# Patient Record
Sex: Male | Born: 1990 | Race: Black or African American | Hispanic: No | Marital: Single | Smoking: Current some day smoker
Health system: Southern US, Community
[De-identification: ages and names within clinical notes are randomized; demographics above are authoritative.]

## PROBLEM LIST (undated history)

## (undated) DIAGNOSIS — E663 Overweight: Secondary | ICD-10-CM

## (undated) DIAGNOSIS — S62339A Displaced fracture of neck of unspecified metacarpal bone, initial encounter for closed fracture: Secondary | ICD-10-CM

## (undated) DIAGNOSIS — Z973 Presence of spectacles and contact lenses: Secondary | ICD-10-CM

## (undated) DIAGNOSIS — Z68.41 Body mass index (BMI) pediatric, 85th percentile to less than 95th percentile for age: Secondary | ICD-10-CM

## (undated) DIAGNOSIS — F17200 Nicotine dependence, unspecified, uncomplicated: Secondary | ICD-10-CM

## (undated) HISTORY — DX: Overweight: Z68.53

## (undated) HISTORY — DX: Body mass index (BMI) pediatric, 85th percentile to less than 95th percentile for age: E66.3

## (undated) HISTORY — DX: Presence of spectacles and contact lenses: Z97.3

## (undated) HISTORY — PX: NO PAST SURGERIES: SHX2092

---
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
## 2011-02-08 ENCOUNTER — Encounter: Payer: Self-pay | Admitting: Internal Medicine

## 2011-02-09 ENCOUNTER — Encounter: Payer: Self-pay | Admitting: Internal Medicine

## 2011-02-09 ENCOUNTER — Ambulatory Visit (INDEPENDENT_AMBULATORY_CARE_PROVIDER_SITE_OTHER): Payer: 59 | Admitting: Internal Medicine

## 2011-02-09 VITALS — BP 100/70 | HR 60 | Temp 98.6°F | Wt 165.0 lb

## 2011-02-09 DIAGNOSIS — L309 Dermatitis, unspecified: Secondary | ICD-10-CM

## 2011-02-09 DIAGNOSIS — L299 Pruritus, unspecified: Secondary | ICD-10-CM

## 2011-02-09 DIAGNOSIS — L259 Unspecified contact dermatitis, unspecified cause: Secondary | ICD-10-CM

## 2011-02-09 MED ORDER — HYDROXYZINE HCL 25 MG PO TABS: 25.0000 mg | ORAL_TABLET | Freq: Three times a day (TID) | ORAL | Status: AC | PRN

## 2011-02-09 MED ORDER — TRIAMCINOLONE ACETONIDE 0.1 % EX OINT: TOPICAL_OINTMENT | Freq: Two times a day (BID) | CUTANEOUS | Status: AC

## 2011-02-09 NOTE — Patient Instructions (Signed)
Use topical steroid ointment. Can take hydroxyzine at night to help itching If not getting better in 7-14 days then  Would have you see dermatologist.

## 2011-02-09 NOTE — Progress Notes (Signed)
  Subjective:    Patient ID: Mitchell Barber, male    DOB: April 16, 1991, 20 y.o.   MRN: [id number]  HPI Patient comes in today for an acute visit. He states he's had itching mostly on his arms forearms for about 2-3 weeks. Initially it was his left thigh that has resolved now is on his forearms initially there was no rationale he has tiny bumps on his exposed areas there is one bump on his abdomen. No known exposures. He uses [employer] and uses cocoa butter lotion to keep his skin moist. The above itching seems to be worse at night.  Had a history of a neck rash about a year ago was treated with cortisone prednisone felt to be contact dermatitis and resolved.   Review of Systems Negative fever chest pain shortness of breath swollen glands other exposures. No GI GU symptoms  Past history family history social history reviewed in the electronic medical record.     Objective:   Physical Exam Well-developed well-nourished in no acute distress some mild to moderate acne on face a few pustules on his neck. Otherwise neck is clear Skin: Somewhat excoriated forearms a few eczematous bumps on the right antecubital fossa there are no burrows noted hands are clear waist is clear he has some chronic changes over her his bucklel meets the skin.       Assessment & Plan:  Itching of forearms.. mild dermatitis.  No obvious burrows and not typical distribution of mites. Discussed avoiding hot showers continue moisturizing use prescription cortisone ointment hydroxyzine at night. Underlying history of contact dermatitis eczema and what appears to be a nickel sensitivity.  Other ?s answered

## 2012-02-16 ENCOUNTER — Other Ambulatory Visit: Payer: Self-pay | Admitting: Internal Medicine

## 2012-03-08 ENCOUNTER — Ambulatory Visit (INDEPENDENT_AMBULATORY_CARE_PROVIDER_SITE_OTHER): Payer: 59

## 2012-03-08 DIAGNOSIS — Z0000 Encounter for general adult medical examination without abnormal findings: Secondary | ICD-10-CM

## 2012-03-08 DIAGNOSIS — Y992 Volunteer activity: Secondary | ICD-10-CM

## 2012-03-10 LAB — TB SKIN TEST: TB Skin Test: NEGATIVE

## 2012-04-21 ENCOUNTER — Ambulatory Visit (INDEPENDENT_AMBULATORY_CARE_PROVIDER_SITE_OTHER): Payer: 59

## 2012-04-21 DIAGNOSIS — Z23 Encounter for immunization: Secondary | ICD-10-CM

## 2013-05-08 ENCOUNTER — Ambulatory Visit (INDEPENDENT_AMBULATORY_CARE_PROVIDER_SITE_OTHER): Payer: 59 | Admitting: Family Medicine

## 2013-05-08 DIAGNOSIS — Z23 Encounter for immunization: Secondary | ICD-10-CM

## 2014-06-26 ENCOUNTER — Ambulatory Visit: Payer: 59

## 2014-07-25 ENCOUNTER — Encounter: Payer: Self-pay | Admitting: Internal Medicine

## 2014-07-25 ENCOUNTER — Ambulatory Visit (INDEPENDENT_AMBULATORY_CARE_PROVIDER_SITE_OTHER): Payer: 59 | Admitting: Internal Medicine

## 2014-07-25 VITALS — BP 122/84 | HR 60 | Temp 98.3°F | Wt 165.4 lb

## 2014-07-25 DIAGNOSIS — F4323 Adjustment disorder with mixed anxiety and depressed mood: Secondary | ICD-10-CM

## 2014-07-25 DIAGNOSIS — R413 Other amnesia: Secondary | ICD-10-CM

## 2014-07-25 MED ORDER — CITALOPRAM HYDROBROMIDE 20 MG PO TABS: 20.0000 mg | ORAL_TABLET | Freq: Every day | ORAL | Status: DC

## 2014-07-25 NOTE — Patient Instructions (Addendum)
Agree with stopping mind altering substances and tobacco  both for  Mental health reasons and  Physical health.  YOu may have some type of anxiety diagnosis and even post traumatic stress disorder as we discussed  That is causing the problem   Advise we get you to see a behavioral specialist .   Consideration of medication to help also .  Decrease memory and concentration  Can occur woith anxiety   THC alcohol lack of sleep and my things.      Posttraumatic Stress Disorder Posttraumatic stress disorder (PTSD) is a mental disorder. It occurs after a traumatic event in your life. The traumatic events that cause PTSD are outside the range of normal human experience. Examples of these events include war, automobile accidents, natural disasters, rape, domestic violence, and violent crimes. Most people who experience these types of events are able to heal on their own. Those who do not heal develop PTSD. PTSD can happen to anyone at any age. However, people with a history of childhood abuse are at increased risk for developing PTSD.  SYMPTOMS  The traumatic event that causes PTSD must be a threat to life, cause serious injury, or involve sexual violence. The traumatic event is usually experienced directly by the person who develops PTSD. Sometimes PTSD occurs in people who witness traumas that occur to others or who hear about a trauma that occurs to a close family member or friend. The following behaviors are characteristic of people with PTSD:  People with PTSD re-experience the traumatic event in one or more of the following ways (intrusion symptoms):  Recurrent, unwanted distressing memories while awake.  Recurrent distressing dreams.  Sensations similar to those felt when the event originally occurred (flashbacks).   Intense or prolonged emotional distress, triggered by reminders of the trauma. This may include fear, horror, intense sadness, or anger.  Marked physical reactions,  triggered by reminders of the trauma. This may include racing heart, shortness of breath, sweating, and shaking.  People with PTSD avoid thoughts, conversations, people, or activities that remind them of the traumatic event (avoidance symptoms).  People with PTSD have negative changes in their thinking and mood after the traumatic event. These changes include:  Inability to remember one or more significant aspects of the traumatic event (memory gaps).  Exaggerated negative perceptions about themselves or others, such as believing that they are bad people or that no one can be trusted.  Unrealistic assignment of blame to themselves or others for the traumatic event.  Persistent negative emotional state, such as fear, horror, anger, sadness, guilt, or shame.  Markedly decreased interest or participation in significant activities.  A loss of connection with other people.  Inability to experience positive emotions, such as happiness or love.  People with PTSD are more sensitive to their environment and react more easily than others (hyperarousal-overreactivity symptoms). These symptoms include:  Irritability, with angry outbursts toward other people or objects. The outbursts are easily triggered and may be verbal or physical.  Careless or self-destructive behavior. This may include reckless driving or drug use.  A feeling of being on edge, with increased alertness (hypervigilance).  Exaggerated reactions to stimuli, such as being easily startled.   Difficulty concentrating.  Difficulty sleeping. PTSD symptoms may start soon after a frightening event or months or years later. They last at least 1 month or longer and can affect one or more areas of functioning, such as social or occupational functioning.  DIAGNOSIS  PTSD is diagnosed through an assessment  by a mental health professional. Bonita QuinYou will be asked questions about the traumatic events in your life. You will also be asked about  how these events have changed your thoughts, mood, behavior, and ability to function on a daily basis. You may be asked about your use of alcohol or drugs, which can make PTSD symptoms worse. TREATMENT  Unlike many mental disorders, which require lifelong management, PTSD is a curable condition. The goal of PTSD treatment is to neutralize the negative effects of the traumatic event on daily functioning, not erase the memory of the event. The following treatments may be prescribed to reach this goal:  Medicines. Certain medicines can reduce some PTSD symptoms. Intrusion symptoms and hyperarousal-overactivity symptoms respond best to medicines.  Counseling (talk therapy). Talk therapy with a mental health professional who is experienced in treating PTSD can help. Talk therapy can provide education, emotional support, and coping skills. Certain types of talk therapy that specifically target the traumatic events are the most effective treatment for PTSD:  Prolonged exposure therapy, which involves remembering and processing the traumatic event with a therapist in a safe environment until it no longer creates a negative emotional response.  Eye movement desensitization and reprocessing therapy, which involves the use of repetitive physical stimulation of the senses that alternates between the right and left sides of the body. It is believed that this therapy facilitates communication between the two sides of the brain. This communication helps the mind to integrate the fragmented memories of the traumatic event into a whole story that makes sense and no longer creates a negative emotional response. Most people with PTSD benefit from a combination of these treatments.  Document Released: 04/13/2001 Document Revised: 12/03/2013 Document Reviewed: 10/05/2012 [facility] Patient Information 2015 [place]Care, [place]. This information is not intended to replace advice given to you by your health care provider. Make sure  you discuss any questions you have with your health care provider.
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
## 2014-09-03 ENCOUNTER — Ambulatory Visit: Payer: 59 | Admitting: Internal Medicine

## 2014-09-03 ENCOUNTER — Encounter: Payer: 59 | Admitting: Internal Medicine

## 2014-09-03 NOTE — Progress Notes (Signed)
Document opened and reviewed for FU visit t . No showed .

## 2014-09-09 ENCOUNTER — Telehealth: Payer: Self-pay | Admitting: Family Medicine

## 2014-09-09 NOTE — Telephone Encounter (Signed)
Please contact patient and see how he is doing ? Seeing acounselor ? Taking med ?      Thanks    [facility]

## 2014-09-11 NOTE — Telephone Encounter (Signed)
LMOM for the pt to return my call. 

## 2014-09-17 NOTE — Telephone Encounter (Signed)
LMOM for the pt to return my call. 

## 2014-09-18 NOTE — Telephone Encounter (Signed)
LMOM for the pt to return my call.  Have attempted to reach the pt several times.  Will now close the note.

## 2014-10-08 ENCOUNTER — Ambulatory Visit (INDEPENDENT_AMBULATORY_CARE_PROVIDER_SITE_OTHER): Payer: 59 | Admitting: Internal Medicine

## 2014-10-08 ENCOUNTER — Encounter: Payer: Self-pay | Admitting: Internal Medicine

## 2014-10-08 VITALS — BP 124/82 | Temp 98.7°F | Ht 68.0 in | Wt 162.0 lb

## 2014-10-08 DIAGNOSIS — F4323 Adjustment disorder with mixed anxiety and depressed mood: Secondary | ICD-10-CM

## 2014-10-08 DIAGNOSIS — Z23 Encounter for immunization: Secondary | ICD-10-CM

## 2014-10-08 DIAGNOSIS — G479 Sleep disorder, unspecified: Secondary | ICD-10-CM

## 2014-10-08 MED ORDER — AMITRIPTYLINE HCL 10 MG PO TABS: 10.0000 mg | ORAL_TABLET | Freq: Every day | ORAL | Status: DC

## 2014-10-08 NOTE — Patient Instructions (Signed)
Try  Elavil at night to see if helps sleep and headaches .   Sleep  should be at night.   In the dark for most people.

## 2014-10-08 NOTE — Progress Notes (Signed)
Pre visit review using our clinic review tool, if applicable. No additional management support is needed unless otherwise documented below in the visit note.  Chief Complaint  Patient presents with  . Follow-up    HPI: Patient Mitchell Barber  comes in today for SDA for  Fu  problem evaluation. See last visit didn't follow up Ran out in a month  End of January . Took for the one rx . 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 Sleep is erratic at times when he was working. Sometimes he is up at night. Denies this is a common problem but does have some legs that have some you when he goes to bed at night. Denies manic behavior. Is trying to cut down on RD use. Is still living at home and would like to get out. But has to get another job. Denies current panic attack symptoms unusual depression uncertain of medicine as needed does get morning headaches with sleep disturbance.   ROS: See pertinent positives and negatives per HPI. A.m. headaches without visual changes. Still living at home was trying to make enough money to move out. Looking for another job. Past Medical History  Diagnosis Date  . Childhood overweight, BMI 85-94.9 percentile     30.34 on 05/06/06  . Wears glasses     Family History  Problem Relation Age of Onset  . Diabetes Mother   . Hypertension Father     History   Social History  . Marital Status: Single    Spouse Name: N/A  . Number of Children: N/A  . Years of Education: N/A   Social History Main Topics  . Smoking status: Former Games developer  . Smokeless tobacco: Not on file     Comment: Hx of smoking marijuana  . Alcohol Use: No  . Drug Use: No     Comment: Hx of smoking marjuana  . Sexual Activity: Not on file   Other Topics Concern  . 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  [employer]   August :         Outpatient Encounter Prescriptions as of 10/08/2014  Medication Sig  . amitriptyline (ELAVIL) 10 MG tablet Take 1 tablet (10 mg total) by mouth at bedtime. Can increase to  20 mg per night.  . [DISCONTINUED] citalopram (CELEXA) 20 MG tablet Take 1 tablet (20 mg total) by mouth daily. Take 10 mg per day for 1 -2 weeks then  20 mg per day (Patient not taking: Reported on 10/08/2014)    EXAM:  BP 124/82 mmHg  Temp(Src) 98.7 F (37.1 C) (Oral)  Ht  (1.727 m)  Wt 162 lb (73.483 kg)  BMI 24.64 kg/m2  Body mass index is 24.64 kg/(m^2).  GENERAL: vitals reviewed and listed above, alert, oriented, appears well hydrated and in no acute distress HEENT: atraumatic, conjunctiva  clear, no obvious abnormalities on inspection of external nose and ears PSYCH: pleasant and cooperative, no obvious depression mildly anxious appears cognitively intact. Normal speech no slurring.  ASSESSMENT AND PLAN:  Discussed the following assessment and plan:  Adjustment disorder with mixed anxiety and depressed mood - May be slightly better took Celexa for a month uncertain help didn't follow-up on time hard  to assess options discussed pamphlet for counseling consider evening  Need for prophylactic vaccination and inoculation against influenza - Plan: Flu Vaccine QUAD 36+ mos PF IM (Fluarix Quad PF) Pamphlet giving for counseling handout option to add Elavil low-dose at night for sleep and headaches. Encouraged him to continue avoid all mind altering substances. Encouraged exercise routine also. Plan follow-up in 2-3 months but can come back in a month and feels that we can be helpful. -Patient advised to return or notify health care team  if symptoms worsen ,persist or new concerns arise.  Patient Instructions  Try  Elavil at night to see if helps sleep and headaches .   Sleep  should be at night.   In the dark for most people.   Neta MendsWanda K. Panosh M.D.  Total visit 25mins > 50% spent  counseling and coordinating care

## 2014-11-28 ENCOUNTER — Encounter: Payer: Self-pay | Admitting: Family Medicine

## 2014-11-28 ENCOUNTER — Ambulatory Visit (INDEPENDENT_AMBULATORY_CARE_PROVIDER_SITE_OTHER): Payer: 59 | Admitting: Family Medicine

## 2014-11-28 VITALS — BP 100/74 | HR 82 | Temp 98.1°F | Ht 68.0 in | Wt 165.2 lb

## 2014-11-28 DIAGNOSIS — J069 Acute upper respiratory infection, unspecified: Secondary | ICD-10-CM | POA: Diagnosis not present

## 2014-11-28 NOTE — Progress Notes (Signed)
HPI:  URI: -started: 1 week -symptoms:nasal congestion, scratchy throat, cough -denies:fever, SOB, NVD, tooth pain -has tried: zyrtec -sick contacts/travel/risks: denies flu exposure, tick exposure or or Ebola risks - everyone at home has a cold -Hx of: allergies  ROS: See pertinent positives and negatives per HPI.  Past Medical History  Diagnosis Date  . Childhood overweight, BMI 85-94.9 percentile     30.34 on 05/06/06  . Wears glasses     No past surgical history on file.  Family History  Problem Relation Age of Onset  . Diabetes Mother   . Hypertension Father     History   Social History  . Marital Status: Single    Spouse Name: N/A  . Number of Children: N/A  . Years of Education: N/A   Social History Main Topics  . Smoking status: Former Games developer  . Smokeless tobacco: Not on file     Comment: Hx of smoking marijuana  . Alcohol Use: No  . Drug Use: No     Comment: Hx of smoking marjuana  . Sexual Activity: Not on file   Other Topics Concern  . 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  [employer]   August :          Current outpatient prescriptions:  .  amitriptyline (ELAVIL) 10 MG tablet, Take 1 tablet (10 mg total) by mouth at bedtime. Can increase to  20 mg per night., Disp: 60 tablet, Rfl: 1  EXAM:  Filed Vitals:   11/28/14 1338  BP: 100/74  Pulse: 82  Temp: 98.1 F (36.7 C)    Body mass index is 25.12 kg/(m^2).  GENERAL: vitals reviewed and listed above, alert, oriented, appears well hydrated and in no acute distress  HEENT: atraumatic, conjunttiva clear, no obvious abnormalities on inspection of external nose and ears, normal appearance of ear canals and TMs, clear nasal congestion, mild post oropharyngeal erythema with PND, no tonsillar edema or exudate, no sinus TTP  NECK: no obvious masses on inspection  LUNGS: clear to  auscultation bilaterally, no wheezes, rales or rhonchi, good air movement  CV: HRRR, no peripheral edema  MS: moves all extremities without noticeable abnormality  PSYCH: pleasant and cooperative, no obvious depression or anxiety  ASSESSMENT AND PLAN:  Discussed the following assessment and plan:  Viral upper respiratory illness  -given HPI and exam findings today, a serious infection or illness is unlikely. We discussed potential etiologies, with VURI being most likely, and advised supportive care and monitoring. We discussed treatment side effects, likely course, antibiotic misuse, transmission, and signs of developing a serious illness. -of course, we advised to return or notify a doctor immediately if symptoms worsen or persist or new concerns arise.    Patient Instructions  Upper Respiratory Infection, Adult An upper respiratory infection (URI) is also sometimes known as the common cold. The upper respiratory tract includes the nose, sinuses, throat, trachea, and bronchi. Bronchi are the airways leading to the lungs. Most people improve within 1 to 3 weeks, but symptoms can last up to 4 weeks. A residual cough may last even longer.  CAUSES Many different viruses can infect the tissues lining the upper respiratory tract. The tissues become irritated and inflamed and often become very moist. Mucus production is also common. A cold is contagious.  You can easily spread the virus to others by oral contact. This includes kissing, sharing a glass, coughing, or sneezing. Touching your mouth or nose and then touching a surface, which is then touched by another person, can also spread the virus. SYMPTOMS  Symptoms typically develop 1 to 3 days after you come in contact with a cold virus. Symptoms vary from person to person. They may include:  Runny nose.  Sneezing.  Nasal congestion.  Sinus irritation.  Sore throat.  Loss of voice (laryngitis).  Cough.  Fatigue.  Muscle  aches.  Loss of appetite.  Headache.  Low-grade fever. DIAGNOSIS  You might diagnose your own cold based on familiar symptoms, since most people get a cold 2 to 3 times a year. Your caregiver can confirm this based on your exam. Most importantly, your caregiver can check that your symptoms are not due to another disease such as strep throat, sinusitis, pneumonia, asthma, or epiglottitis. Blood tests, throat tests, and X-rays are not necessary to diagnose a common cold, but they may sometimes be helpful in excluding other more serious diseases. Your caregiver will decide if any further tests are required. RISKS AND COMPLICATIONS  You may be at risk for a more severe case of the common cold if you smoke cigarettes, have chronic heart disease (such as heart failure) or lung disease (such as asthma), or if you have a weakened immune system. The very young and very old are also at risk for more serious infections. Bacterial sinusitis, middle ear infections, and bacterial pneumonia can complicate the common cold. The common cold can worsen asthma and chronic obstructive pulmonary disease (COPD). Sometimes, these complications can require emergency medical care and may be life-threatening. PREVENTION  The best way to protect against getting a cold is to practice good hygiene. Avoid oral or hand contact with people with cold symptoms. Wash your hands often if contact occurs. There is no clear evidence that vitamin C, vitamin E, echinacea, or exercise reduces the chance of developing a cold. However, it is always recommended to get plenty of rest and practice good nutrition. TREATMENT  Treatment is directed at relieving symptoms. There is no cure. Antibiotics are not effective, because the infection is caused by a virus, not by bacteria. Treatment may include:  Increased fluid intake. Sports drinks offer valuable electrolytes, sugars, and fluids.  Breathing heated mist or steam (vaporizer or  shower).  Eating chicken soup or other clear broths, and maintaining good nutrition.  Getting plenty of rest.  Using gargles or lozenges for comfort.  Controlling fevers with ibuprofen or acetaminophen as directed by your caregiver.  Increasing usage of your inhaler if you have asthma. Zinc gel and zinc lozenges, taken in the first 24 hours of the common cold, can shorten the duration and lessen the severity of symptoms. Pain medicines may help with fever, muscle aches, and throat pain. A variety of non-prescription medicines are available to treat congestion and runny nose. Your caregiver can make recommendations and may suggest nasal or lung inhalers for other symptoms.  HOME CARE INSTRUCTIONS   Only take over-the-counter or prescription medicines for pain, discomfort, or fever as directed by your caregiver.  Use a warm mist humidifier or inhale steam from a shower to increase air moisture. This may keep secretions moist and make it easier to breathe.  Drink enough water and fluids to keep your urine clear or pale yellow.  Rest as needed.  Return to work when your temperature has returned to normal  or as your caregiver advises. You may need to stay home longer to avoid infecting others. You can also use a face mask and careful hand washing to prevent spread of the virus. SEEK MEDICAL CARE IF:   After the first 1-2 weeks, you feel you are getting worse rather than better.  You need your caregiver's advice about medicines to control symptoms.  You develop chills, worsening shortness of breath, or brown or red sputum. These may be signs of pneumonia.  You develop pain in the face, especially when you bend forward. These may be signs of sinusitis.  You develop a fever, swollen neck glands, pain with swallowing, or white areas in the back of your throat. These may be signs of strep throat. SEEK IMMEDIATE MEDICAL CARE IF:   You have a fever.  You develop severe or persistent  headache, ear pain, sinus pain, or chest pain.  You develop wheezing, a prolonged cough, cough up blood, or have a change in your usual mucus (if you have chronic lung disease).  You develop sore muscles or a stiff neck. Document Released: 01/12/2001 Document Revised: 10/11/2011 Document Reviewed: 10/24/2013 [facility] Patient Information 2015 [place], [place]. This information is not intended to replace advice given to you by your health care provider. Make sure you discuss any questions you have with your health care provider.      Kriste Basque R.

## 2014-11-28 NOTE — Progress Notes (Signed)
Pre visit review using our clinic review tool, if applicable. No additional management support is needed unless otherwise documented below in the visit note. 

## 2014-11-28 NOTE — Patient Instructions (Signed)
Upper Respiratory Infection, Adult An upper respiratory infection (URI) is also sometimes known as the common cold. The upper respiratory tract includes the nose, sinuses, throat, trachea, and bronchi. Bronchi are the airways leading to the lungs. Most people improve within 1 to 3 weeks, but symptoms can last up to 4 weeks. A residual cough may last even longer.  CAUSES Many different viruses can infect the tissues lining the upper respiratory tract. The tissues become irritated and inflamed and often become very moist. Mucus production is also common. A cold is contagious. You can easily spread the virus to others by oral contact. This includes kissing, sharing a glass, coughing, or sneezing. Touching your mouth or nose and then touching a surface, which is then touched by another person, can also spread the virus. SYMPTOMS  Symptoms typically develop 1 to 3 days after you come in contact with a cold virus. Symptoms vary from person to person. They may include:  Runny nose.  Sneezing.  Nasal congestion.  Sinus irritation.  Sore throat.  Loss of voice (laryngitis).  Cough.  Fatigue.  Muscle aches.  Loss of appetite.  Headache.  Low-grade fever. DIAGNOSIS  You might diagnose your own cold based on familiar symptoms, since most people get a cold 2 to 3 times a year. Your caregiver can confirm this based on your exam. Most importantly, your caregiver can check that your symptoms are not due to another disease such as strep throat, sinusitis, pneumonia, asthma, or epiglottitis. Blood tests, throat tests, and X-rays are not necessary to diagnose a common cold, but they may sometimes be helpful in excluding other more serious diseases. Your caregiver will decide if any further tests are required. RISKS AND COMPLICATIONS  You may be at risk for a more severe case of the common cold if you smoke cigarettes, have chronic heart disease (such as heart failure) or lung disease (such as asthma),  or if you have a weakened immune system. The very young and very old are also at risk for more serious infections. Bacterial sinusitis, middle ear infections, and bacterial pneumonia can complicate the common cold. The common cold can worsen asthma and chronic obstructive pulmonary disease (COPD). Sometimes, these complications can require emergency medical care and may be life-threatening. PREVENTION  The best way to protect against getting a cold is to practice good hygiene. Avoid oral or hand contact with people with cold symptoms. Wash your hands often if contact occurs. There is no clear evidence that vitamin C, vitamin E, echinacea, or exercise reduces the chance of developing a cold. However, it is always recommended to get plenty of rest and practice good nutrition. TREATMENT  Treatment is directed at relieving symptoms. There is no cure. Antibiotics are not effective, because the infection is caused by a virus, not by bacteria. Treatment may include:  Increased fluid intake. Sports drinks offer valuable electrolytes, sugars, and fluids.  Breathing heated mist or steam (vaporizer or shower).  Eating chicken soup or other clear broths, and maintaining good nutrition.  Getting plenty of rest.  Using gargles or lozenges for comfort.  Controlling fevers with ibuprofen or acetaminophen as directed by your caregiver.  Increasing usage of your inhaler if you have asthma. Zinc gel and zinc lozenges, taken in the first 24 hours of the common cold, can shorten the duration and lessen the severity of symptoms. Pain medicines may help with fever, muscle aches, and throat pain. A variety of non-prescription medicines are available to treat congestion and runny nose.  Your caregiver can make recommendations and may suggest nasal or lung inhalers for other symptoms.  HOME CARE INSTRUCTIONS   Only take over-the-counter or prescription medicines for pain, discomfort, or fever as directed by your  caregiver.  Use a warm mist humidifier or inhale steam from a shower to increase air moisture. This may keep secretions moist and make it easier to breathe.  Drink enough water and fluids to keep your urine clear or pale yellow.  Rest as needed.  Return to work when your temperature has returned to normal or as your caregiver advises. You may need to stay home longer to avoid infecting others. You can also use a face mask and careful hand washing to prevent spread of the virus. SEEK MEDICAL CARE IF:   After the first 1-2 weeks, you feel you are getting worse rather than better.  You need your caregiver's advice about medicines to control symptoms.  You develop chills, worsening shortness of breath, or brown or red sputum. These may be signs of pneumonia.  You develop pain in the face, especially when you bend forward. These may be signs of sinusitis.  You develop a fever, swollen neck glands, pain with swallowing, or white areas in the back of your throat. These may be signs of strep throat. SEEK IMMEDIATE MEDICAL CARE IF:   You have a fever.  You develop severe or persistent headache, ear pain, sinus pain, or chest pain.  You develop wheezing, a prolonged cough, cough up blood, or have a change in your usual mucus (if you have chronic lung disease).  You develop sore muscles or a stiff neck. Document Released: 01/12/2001 Document Revised: 10/11/2011 Document Reviewed: 10/24/2013 [facility] Patient Information 2015 [place], [place]. This information is not intended to replace advice given to you by your health care provider. Make sure you discuss any questions you have with your health care provider.

## 2016-05-21 ENCOUNTER — Ambulatory Visit (HOSPITAL_COMMUNITY)
Admission: EM | Admit: 2016-05-21 | Discharge: 2016-05-21 | Disposition: A | Discharge status: Home / Self Care | Payer: 59 | Attending: Family Medicine | Admitting: Family Medicine

## 2016-05-21 ENCOUNTER — Encounter (HOSPITAL_COMMUNITY): Payer: Self-pay | Admitting: Emergency Medicine

## 2016-05-21 ENCOUNTER — Ambulatory Visit (INDEPENDENT_AMBULATORY_CARE_PROVIDER_SITE_OTHER): Payer: 59

## 2016-05-21 DIAGNOSIS — S62339A Displaced fracture of neck of unspecified metacarpal bone, initial encounter for closed fracture: Secondary | ICD-10-CM

## 2016-05-21 DIAGNOSIS — S62396A Other fracture of fifth metacarpal bone, right hand, initial encounter for closed fracture: Secondary | ICD-10-CM | POA: Diagnosis not present

## 2016-05-21 NOTE — ED Provider Notes (Signed)
CSN: [id number]     Arrival date & time 05/21/16  1708 History   First MD Initiated Contact with Patient 05/21/16 1725     Chief Complaint  Patient presents with  . Finger Injury   (Consider location/radiation/quality/duration/timing/severity/associated sxs/prior Treatment) HPI NP Pt punched wall 5 days ago. Still complaining of pain.  Pain with clinching fist. No treatment at home.  Past Medical History:  Diagnosis Date  . Childhood overweight, BMI 85-94.9 percentile    30.34 on 05/06/06  . Wears glasses    History reviewed. No pertinent surgical history. Family History  Problem Relation Age of Onset  . Diabetes Mother   . Hypertension Father    Social History  Substance Use Topics  . Smoking status: Current Some Day Smoker    Packs/day: 0.50    Years: 5.00    Types: Cigarettes  . Smokeless tobacco: Never Used     Comment: Hx of smoking marijuana  . Alcohol use No    Review of Systems  Denies: HEADACHE, NAUSEA, ABDOMINAL PAIN, CHEST PAIN, CONGESTION, DYSURIA, SHORTNESS OF BREATH  Allergies  Review of patient's allergies indicates no known allergies.  Home Medications   Prior to Admission medications   Medication Sig Start Date End Date Taking? Authorizing Provider  amitriptyline (ELAVIL) 10 MG tablet Take 1 tablet (10 mg total) by mouth at bedtime. Can increase to  20 mg per night. 10/08/14   Madelin Headings, MD   Meds Ordered and Administered this Visit  Medications - No data to display  BP 123/64 (BP Location: Left Arm)   Pulse 78   Temp 98.3 F (36.8 C) (Oral)   Resp 12   SpO2 98%  No data found.   Physical Exam  NURSES NOTES AND VITAL SIGNS REVIEWED. CONSTITUTIONAL: Well developed, well nourished, no acute distress HEENT: normocephalic, atraumatic EYES: Conjunctiva normal NECK:normal ROM, supple, no adenopathy PULMONARY:No respiratory distress, normal effort ABDOMINAL: Soft, ND, NT BS+, No CVAT MUSCULOSKELETAL: Normal ROM of all extremities, right  hand 5th MC.  With rotation.  SKIN: warm and dry without rash PSYCHIATRIC: Mood and affect, behavior are normal   Urgent Care Course   Clinical Course  splint applied by student and myself  Procedures (including critical care time)  Labs Review Labs Reviewed - No data to display  Imaging Review Dg Hand Complete Right  Result Date: 05/21/2016 CLINICAL DATA:  25 year old male with history of trauma to the right hand after striking a cement wall 5 days ago. Swelling around the right fifth MCP joint. EXAM: RIGHT HAND - COMPLETE 3+ VIEW COMPARISON:  No priors. FINDINGS: There is an acute displaced angulated fracture of the distal aspect of the fifth metacarpal, with approximately 60 degrees of volar/radial angulation, and minimal (less than half shaft width) volar displacement. The fracture does not appear to extend to the articular surface at the fifth MCP joint. Overlying soft tissues are swollen. Remaining bones of the hand are otherwise intact. IMPRESSION: 1. Minimally displaced angulated fracture of the distal third of the right fifth metacarpal, as above. Electronically Signed   By: Trudie Reed M.D.   On: 05/21/2016 18:11     Visual Acuity Review  Right Eye Distance:   Left Eye Distance:   Bilateral Distance:    Right Eye Near:   Left Eye Near:    Bilateral Near:         MDM   1. Closed boxer's fracture, initial encounter     Patient is reassured that there  are no issues that require transfer to higher level of care at this time or additional tests. Patient is advised to continue home symptomatic treatment. Patient is advised that if there are new or worsening symptoms to attend the emergency department, contact primary care provider, or return to UC. Instructions of care provided discharged home in stable condition.    THIS NOTE WAS GENERATED USING A VOICE RECOGNITION SOFTWARE PROGRAM. ALL REASONABLE EFFORTS  WERE MADE TO PROOFREAD THIS DOCUMENT FOR  ACCURACY.  I have verbally reviewed the discharge instructions with the patient. A printed AVS was given to the patient.  All questions were answered prior to discharge.      Tharon Aquas, PA 05/21/16 [phone]

## 2016-05-21 NOTE — ED Triage Notes (Signed)
[facility] a [facility] to the pinky finger on his right hand. The patient stated that he punched a cement wall 5 days ago.

## 2016-05-26 ENCOUNTER — Other Ambulatory Visit: Payer: Self-pay | Admitting: Orthopedic Surgery

## 2016-05-26 DIAGNOSIS — S62326A Displaced fracture of shaft of fifth metacarpal bone, right hand, initial encounter for closed fracture: Secondary | ICD-10-CM | POA: Diagnosis not present

## 2016-06-02 ENCOUNTER — Encounter (HOSPITAL_BASED_OUTPATIENT_CLINIC_OR_DEPARTMENT_OTHER): Payer: Self-pay | Admitting: *Deleted

## 2016-06-03 NOTE — H&P (Signed)
Mitchell Barber is an 25 y.o. male.   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  He was seen in emergency department due to a self-inflicted hand injury when he punched a wall.  The patient was x-rayed, placed into a ulnar gutter splint, and referred to us for further evaluation and treatment.  Past Medical History:  Diagnosis Date  . Boxer's metacarpal fracture, neck, closed    right  . Childhood overweight, BMI 85-94.9 percentile    30.34 on 05/06/06  . Smoker   . Wears glasses     Past Surgical History:  Procedure Laterality Date  . NO PAST SURGERIES      Family History  Problem Relation Age of Onset  . Diabetes Mother   . Hypertension Father    Social History:  reports that he has been smoking Cigarettes.  He has a 2.50 pack-year smoking history. He has never used smokeless tobacco. He reports that he drinks alcohol. He reports that he uses drugs, including Marijuana.  Allergies: No Known Allergies  No prescriptions prior to admission.    No results found for this or any previous visit (from the past 48 hour(s)). No results found.  Review of Systems  All other systems reviewed and are negative.   Height 5\' 7"  (1.702 m), weight 81.6 kg (180 lb). Physical Exam  Constitutional:  WD, WN, NAD HEENT:  NCAT, EOMI Neuro/Psych:  Alert & oriented to person, place, and time; appropriate mood & affect Lymphatic: No generalized UE edema or lymphadenopathy Extremities / MSK:  Both UE are normal with respect to appearance, ranges of motion, joint stability, muscle strength/tone, sensation, & perfusion except as otherwise noted:  The right hand has swelling about the dorsum.  There is a depression in his metacarpal where his MP joint should be.  Just proximal to this there is a large bump which is the angulation of the fracture.  The  patient is able to make a full fist with no malrotation of the small finger and is NVI.  Labs / X-rays:  No radiographic studies obtained today.  X-rays from 05/21/2016 were evaluated including 3 views of the right hand and demonstrated a closed, impacted, comminuted, extra-articular fifth metacarpal distal shaft fracture with shortening and approx. 50-60 degrees volar angulation  Assessment: Right fifth metacarpal fracture  Plan:  The findings are discussed with the patient.  He reports that he needs to finish his schooling including his final exam which is practical where he needs to demonstrate his skill level.  He would like to proceed with ORIF right fifth metacarpal fracture upon completion of his schooling, and has asked to have this deferred until 06-07-16.  The details of the operative procedure were discussed with the patient.  Questions were invited and answered.  In addition to the goal of the procedure, the risks of the procedure to include but not limited to bleeding; infection; damage to the nerves or blood vessels that could result in bleeding, numbness, weakness, chronic pain, and the need for additional procedures; stiffness; the need for revision surgery; and anesthetic risks were reviewed.  No specific outcome was guaranteed or implied.  Informed consent was obtained.   Mitchell Barber A., MD 06/03/2016, 10:51 AM

## 2016-06-07 ENCOUNTER — Ambulatory Visit (HOSPITAL_BASED_OUTPATIENT_CLINIC_OR_DEPARTMENT_OTHER)
Admission: RE | Admit: 2016-06-07 | Discharge: 2016-06-07 | Disposition: A | Discharge status: Home / Self Care | Payer: 59 | Source: Ambulatory Visit | Attending: Orthopedic Surgery | Admitting: Orthopedic Surgery

## 2016-06-07 ENCOUNTER — Ambulatory Visit (HOSPITAL_BASED_OUTPATIENT_CLINIC_OR_DEPARTMENT_OTHER): Payer: 59 | Admitting: Anesthesiology

## 2016-06-07 ENCOUNTER — Ambulatory Visit (HOSPITAL_COMMUNITY): Payer: 59

## 2016-06-07 ENCOUNTER — Encounter (HOSPITAL_BASED_OUTPATIENT_CLINIC_OR_DEPARTMENT_OTHER)
Admission: RE | Disposition: A | Discharge status: Home / Self Care | Payer: Self-pay | Source: Ambulatory Visit | Attending: Orthopedic Surgery

## 2016-06-07 ENCOUNTER — Encounter (HOSPITAL_BASED_OUTPATIENT_CLINIC_OR_DEPARTMENT_OTHER): Payer: Self-pay | Admitting: *Deleted

## 2016-06-07 DIAGNOSIS — Y939 Activity, unspecified: Secondary | ICD-10-CM | POA: Diagnosis not present

## 2016-06-07 DIAGNOSIS — S62336A Displaced fracture of neck of fifth metacarpal bone, right hand, initial encounter for closed fracture: Secondary | ICD-10-CM | POA: Insufficient documentation

## 2016-06-07 DIAGNOSIS — L309 Dermatitis, unspecified: Secondary | ICD-10-CM | POA: Diagnosis not present

## 2016-06-07 DIAGNOSIS — W2209XA Striking against other stationary object, initial encounter: Secondary | ICD-10-CM | POA: Insufficient documentation

## 2016-06-07 DIAGNOSIS — T148XXA Other injury of unspecified body region, initial encounter: Secondary | ICD-10-CM

## 2016-06-07 DIAGNOSIS — Z8249 Family history of ischemic heart disease and other diseases of the circulatory system: Secondary | ICD-10-CM | POA: Diagnosis not present

## 2016-06-07 DIAGNOSIS — S62326A Displaced fracture of shaft of fifth metacarpal bone, right hand, initial encounter for closed fracture: Secondary | ICD-10-CM | POA: Diagnosis not present

## 2016-06-07 DIAGNOSIS — F17210 Nicotine dependence, cigarettes, uncomplicated: Secondary | ICD-10-CM | POA: Insufficient documentation

## 2016-06-07 DIAGNOSIS — F1290 Cannabis use, unspecified, uncomplicated: Secondary | ICD-10-CM | POA: Diagnosis not present

## 2016-06-07 DIAGNOSIS — S62306D Unspecified fracture of fifth metacarpal bone, right hand, subsequent encounter for fracture with routine healing: Secondary | ICD-10-CM | POA: Diagnosis not present

## 2016-06-07 DIAGNOSIS — Z833 Family history of diabetes mellitus: Secondary | ICD-10-CM | POA: Diagnosis not present

## 2016-06-07 DIAGNOSIS — S62306A Unspecified fracture of fifth metacarpal bone, right hand, initial encounter for closed fracture: Secondary | ICD-10-CM | POA: Diagnosis not present

## 2016-06-07 HISTORY — DX: Nicotine dependence, unspecified, uncomplicated: F17.200

## 2016-06-07 HISTORY — PX: OPEN REDUCTION INTERNAL FIXATION (ORIF) METACARPAL: SHX6234

## 2016-06-07 HISTORY — DX: Displaced fracture of neck of unspecified metacarpal bone, initial encounter for closed fracture: S62.339A

## 2016-06-07 SURGERY — OPEN REDUCTION INTERNAL FIXATION (ORIF) METACARPAL
Anesthesia: General | Site: Hand | Laterality: Right

## 2016-06-07 MED ORDER — OXYCODONE HCL 5 MG/5ML PO SOLN: 5.0000 mg | Freq: Once | ORAL | Status: DC | PRN

## 2016-06-07 MED ORDER — CEFAZOLIN SODIUM-DEXTROSE 2-4 GM/100ML-% IV SOLN: 2.0000 g | INTRAVENOUS | Status: DC

## 2016-06-07 MED ORDER — PROPOFOL 10 MG/ML IV BOLUS
INTRAVENOUS | Status: AC
  Filled 2016-06-07: qty 20

## 2016-06-07 MED ORDER — HYDROMORPHONE HCL 1 MG/ML IJ SOLN: 0.2500 mg | INTRAMUSCULAR | Status: DC | PRN

## 2016-06-07 MED ORDER — DEXAMETHASONE SODIUM PHOSPHATE 4 MG/ML IJ SOLN
INTRAMUSCULAR | Status: DC | PRN
  Administered 2016-06-07: 10 mg via INTRAVENOUS

## 2016-06-07 MED ORDER — OXYCODONE HCL 5 MG PO TABS: 5.0000 mg | ORAL_TABLET | ORAL | 0 refills | Status: DC | PRN

## 2016-06-07 MED ORDER — LACTATED RINGERS IV SOLN
INTRAVENOUS | Status: DC
Start: 2016-06-07 — End: 2016-06-07

## 2016-06-07 MED ORDER — IBUPROFEN 200 MG PO TABS: 600.0000 mg | ORAL_TABLET | Freq: Four times a day (QID) | ORAL | Status: AC | PRN

## 2016-06-07 MED ORDER — OXYCODONE HCL 5 MG PO TABS
5.0000 mg | ORAL_TABLET | Freq: Once | ORAL | Status: DC | PRN
Start: 2016-06-07 — End: 2016-06-07

## 2016-06-07 MED ORDER — ONDANSETRON HCL 4 MG/2ML IJ SOLN
INTRAMUSCULAR | Status: DC | PRN
  Administered 2016-06-07: 4 mg via INTRAVENOUS

## 2016-06-07 MED ORDER — ACETAMINOPHEN 325 MG PO TABS: 650.0000 mg | ORAL_TABLET | Freq: Four times a day (QID) | ORAL | Status: AC | PRN

## 2016-06-07 MED ORDER — ONDANSETRON HCL 4 MG PO TABS: 4.0000 mg | ORAL_TABLET | Freq: Three times a day (TID) | ORAL | 0 refills | Status: DC | PRN

## 2016-06-07 MED ORDER — MIDAZOLAM HCL 2 MG/2ML IJ SOLN
INTRAMUSCULAR | Status: AC
  Filled 2016-06-07: qty 2

## 2016-06-07 MED ORDER — ATROPINE SULFATE 0.4 MG/ML IJ SOLN
INTRAMUSCULAR | Status: DC | PRN
  Administered 2016-06-07: 0.2 mg via INTRAVENOUS

## 2016-06-07 MED ORDER — BUPIVACAINE HCL 0.5 % IJ SOLN
INTRAMUSCULAR | Status: DC | PRN
  Administered 2016-06-07: 10 mL

## 2016-06-07 MED ORDER — LACTATED RINGERS IV SOLN: INTRAVENOUS | Status: DC

## 2016-06-07 MED ORDER — MIDAZOLAM HCL 2 MG/2ML IJ SOLN
1.0000 mg | INTRAMUSCULAR | Status: DC | PRN
  Administered 2016-06-07: 2 mg via INTRAVENOUS

## 2016-06-07 MED ORDER — LIDOCAINE 2% (20 MG/ML) 5 ML SYRINGE
INTRAMUSCULAR | Status: DC | PRN
  Administered 2016-06-07: 60 mg via INTRAVENOUS

## 2016-06-07 MED ORDER — DEXAMETHASONE SODIUM PHOSPHATE 10 MG/ML IJ SOLN
INTRAMUSCULAR | Status: AC
  Filled 2016-06-07: qty 1

## 2016-06-07 MED ORDER — SCOPOLAMINE 1 MG/3DAYS TD PT72: 1.0000 | MEDICATED_PATCH | Freq: Once | TRANSDERMAL | Status: DC | PRN

## 2016-06-07 MED ORDER — ONDANSETRON HCL 4 MG/2ML IJ SOLN
INTRAMUSCULAR | Status: AC
  Filled 2016-06-07: qty 2

## 2016-06-07 MED ORDER — CEFAZOLIN SODIUM-DEXTROSE 2-4 GM/100ML-% IV SOLN
INTRAVENOUS | Status: AC
  Filled 2016-06-07: qty 100

## 2016-06-07 MED ORDER — PROPOFOL 10 MG/ML IV BOLUS
INTRAVENOUS | Status: DC | PRN
  Administered 2016-06-07: 30 mg via INTRAVENOUS
  Administered 2016-06-07: 200 mg via INTRAVENOUS

## 2016-06-07 MED ORDER — LACTATED RINGERS IV SOLN
INTRAVENOUS | Status: DC
  Administered 2016-06-07 (×2): via INTRAVENOUS

## 2016-06-07 MED ORDER — FENTANYL CITRATE (PF) 100 MCG/2ML IJ SOLN
50.0000 ug | INTRAMUSCULAR | Status: DC | PRN
  Administered 2016-06-07: 100 ug via INTRAVENOUS
  Administered 2016-06-07: 25 ug via INTRAVENOUS

## 2016-06-07 MED ORDER — MEPERIDINE HCL 25 MG/ML IJ SOLN: 6.2500 mg | INTRAMUSCULAR | Status: DC | PRN

## 2016-06-07 MED ORDER — LIDOCAINE 2% (20 MG/ML) 5 ML SYRINGE
INTRAMUSCULAR | Status: AC
  Filled 2016-06-07: qty 5

## 2016-06-07 MED ORDER — FENTANYL CITRATE (PF) 100 MCG/2ML IJ SOLN
INTRAMUSCULAR | Status: AC
  Filled 2016-06-07: qty 2

## 2016-06-07 MED ORDER — PROMETHAZINE HCL 25 MG/ML IJ SOLN: 6.2500 mg | INTRAMUSCULAR | Status: DC | PRN

## 2016-06-07 SURGICAL SUPPLY — 62 items
BIT DRILL 1.1 (BIT) ×2
BIT DRILL 1.1MM (BIT) ×1
BIT DRILL 60X20X1.1XQC TMX (BIT) IMPLANT
BIT DRL 60X20X1.1XQC TMX (BIT) ×1
BLADE MINI RND TIP GREEN BEAV (BLADE) IMPLANT
BLADE SURG 15 STRL LF DISP TIS (BLADE) ×1 IMPLANT
BLADE SURG 15 STRL SS (BLADE) ×3
BNDG CMPR 9X4 STRL LF SNTH (GAUZE/BANDAGES/DRESSINGS) ×1
BNDG COHESIVE 4X5 TAN STRL (GAUZE/BANDAGES/DRESSINGS) ×3 IMPLANT
BNDG ESMARK 4X9 LF (GAUZE/BANDAGES/DRESSINGS) ×3 IMPLANT
BNDG GAUZE ELAST 4 BULKY (GAUZE/BANDAGES/DRESSINGS) ×3 IMPLANT
CANISTER SUCTION 1200CC (MISCELLANEOUS) IMPLANT
CHLORAPREP W/TINT 26ML (MISCELLANEOUS) ×3 IMPLANT
CORDS BIPOLAR (ELECTRODE) ×3 IMPLANT
COVER BACK TABLE 60X90IN (DRAPES) ×3 IMPLANT
COVER MAYO STAND STRL (DRAPES) ×3 IMPLANT
CUFF TOURNIQUET SINGLE 18IN (TOURNIQUET CUFF) IMPLANT
DRAPE C-ARM 42X72 X-RAY (DRAPES) ×3 IMPLANT
DRAPE EXTREMITY T 121X128X90 (DRAPE) ×3 IMPLANT
DRAPE SURG 17X23 STRL (DRAPES) ×3 IMPLANT
DRIVER BIT 1.5 (TRAUMA) ×2 IMPLANT
DRSG EMULSION OIL 3X3 NADH (GAUZE/BANDAGES/DRESSINGS) ×3 IMPLANT
GLOVE BIO SURGEON STRL SZ7.5 (GLOVE) ×3 IMPLANT
GLOVE BIOGEL PI IND STRL 7.0 (GLOVE) ×1 IMPLANT
GLOVE BIOGEL PI IND STRL 8 (GLOVE) ×1 IMPLANT
GLOVE BIOGEL PI INDICATOR 7.0 (GLOVE) ×2
GLOVE BIOGEL PI INDICATOR 8 (GLOVE) ×2
GLOVE ECLIPSE 6.5 STRL STRAW (GLOVE) ×3 IMPLANT
GOWN STRL REUS W/ TWL LRG LVL3 (GOWN DISPOSABLE) ×2 IMPLANT
GOWN STRL REUS W/TWL LRG LVL3 (GOWN DISPOSABLE) ×6
GOWN STRL REUS W/TWL XL LVL3 (GOWN DISPOSABLE) ×3 IMPLANT
K-WIRE DBL TRONS .035X6 (WIRE) ×3
KWIRE DBL TRONS .035X6 (WIRE) IMPLANT
NDL HYPO 25X1 1.5 SAFETY (NEEDLE) IMPLANT
NEEDLE HYPO 25X1 1.5 SAFETY (NEEDLE) IMPLANT
NS IRRIG 1000ML POUR BTL (IV SOLUTION) ×3 IMPLANT
PACK BASIN DAY SURGERY FS (CUSTOM PROCEDURE TRAY) ×3 IMPLANT
PADDING CAST ABS 4INX4YD NS (CAST SUPPLIES)
PADDING CAST ABS COTTON 4X4 ST (CAST SUPPLIES) IMPLANT
PLATE T SMALL 1.5MM (Plate) ×2 IMPLANT
SCREW L 1.5X12 (Screw) ×4 IMPLANT
SCREW L 1.5X14 (Screw) ×2 IMPLANT
SCREW LOCKING 1.5X11MM (Screw) ×2 IMPLANT
SCREW LOCKING 1.5X13MM (Screw) ×4 IMPLANT
SCREW LOCKING 1.5X18MM (Screw) ×4 IMPLANT
SLEEVE SCD COMPRESS KNEE MED (MISCELLANEOUS) ×3 IMPLANT
SPLINT PLASTER CAST XFAST 3X15 (CAST SUPPLIES) ×1 IMPLANT
SPLINT PLASTER XTRA FASTSET 3X (CAST SUPPLIES) ×2
SPONGE GAUZE 4X4 12PLY STER LF (GAUZE/BANDAGES/DRESSINGS) ×3 IMPLANT
STOCKINETTE 6  STRL (DRAPES) ×2
STOCKINETTE 6 STRL (DRAPES) ×1 IMPLANT
SUCTION FRAZIER HANDLE 10FR (MISCELLANEOUS)
SUCTION TUBE FRAZIER 10FR DISP (MISCELLANEOUS) IMPLANT
SUT VICRYL RAPIDE 4-0 (SUTURE) IMPLANT
SUT VICRYL RAPIDE 4/0 PS 2 (SUTURE) IMPLANT
SYR 10ML LL (SYRINGE) IMPLANT
SYR BULB 3OZ (MISCELLANEOUS) IMPLANT
TOWEL OR 17X24 6PK STRL BLUE (TOWEL DISPOSABLE) ×3 IMPLANT
TOWEL OR NON WOVEN STRL DISP B (DISPOSABLE) IMPLANT
TUBE CONNECTING 20'X1/4 (TUBING)
TUBE CONNECTING 20X1/4 (TUBING) IMPLANT
UNDERPAD 30X30 (UNDERPADS AND DIAPERS) ×3 IMPLANT

## 2016-06-07 NOTE — Transfer of Care (Signed)
Immediate Anesthesia Transfer of Care Note  Patient: Jeri CosReginald L Alvizo  Procedure(s) Performed: Procedure(s) with comments: OPEN REDUCTION INTERNAL FIXATION OF RIGHT 5TH METACARPAL FRACTURE (Right) - OPEN REDUCTION INTERNAL FIXATION OF RIGHT 5TH METACARPAL FRACTURE  Patient Location: PACU  Anesthesia Type:General  Level of Consciousness: awake, sedated and patient cooperative  Airway & Oxygen Therapy: Patient Spontanous Breathing and Patient connected to face mask oxygen  Post-op Assessment: Report given to RN and Post -op Vital signs reviewed and stable  Post vital signs: Reviewed and stable  Last Vitals:  Vitals:   06/07/16 0938  BP: 132/75  Pulse: (!) 55  Resp: 16  Temp: 36.7 C    Last Pain:  Vitals:   06/07/16 0938  TempSrc: Oral         Complications: No apparent anesthesia complications

## 2016-06-07 NOTE — Anesthesia Preprocedure Evaluation (Addendum)
Anesthesia Evaluation  Patient identified by MRN, date of birth, ID band Patient awake    Reviewed: Allergy & Precautions, NPO status , Patient's Chart, lab work & pertinent test results  Airway Mallampati: I  TM Distance: >3 FB Neck ROM: Full    Dental  (+) Teeth Intact, Dental Advisory Given   Pulmonary Current Smoker,    breath sounds clear to auscultation       Cardiovascular negative cardio ROS   Rhythm:Regular Rate:Normal     Neuro/Psych negative neurological ROS  negative psych ROS   GI/Hepatic negative GI ROS, Neg liver ROS,   Endo/Other  negative endocrine ROS  Renal/GU negative Renal ROS  negative genitourinary   Musculoskeletal negative musculoskeletal ROS (+)   Abdominal   Peds negative pediatric ROS (+)  Hematology negative hematology ROS (+)   Anesthesia Other Findings   Reproductive/Obstetrics negative OB ROS                             Anesthesia Physical Anesthesia Plan  ASA: II  Anesthesia Plan: General   Post-op Pain Management:    Induction: Intravenous  Airway Management Planned: LMA  Additional Equipment:   Intra-op Plan:   Post-operative Plan: Extubation in OR  Informed Consent: I have reviewed the patients History and Physical, chart, labs and discussed the procedure including the risks, benefits and alternatives for the proposed anesthesia with the patient or authorized representative who has indicated his/her understanding and acceptance.   Dental advisory given  Plan Discussed with: CRNA  Anesthesia Plan Comments:         Anesthesia Quick Evaluation  

## 2016-06-07 NOTE — Discharge Instructions (Addendum)
Discharge Instructions   You have a dressing with a plaster splint incorporated in it. Move your fingers as much as possible, making a full fist and fully opening the fist. Elevate your hand to reduce pain & swelling of the digits.  Ice over the operative site may be helpful to reduce pain & swelling.  DO NOT USE HEAT. Pain medicine has been prescribed for you.  Use your medicine as needed over the first 48 hours, and then you can begin to taper your use.  You may use Tylenol in place of your prescribed pain medication, but not IN ADDITION to it. Leave the dressing in place until you return to our office.  You may shower, but keep the bandage clean & dry.  You may drive a car when you are off of prescription pain medications and can safely control your vehicle with both hands. Our office will call you to arrange follow-up   Please call [phone] during normal business hours or [phone] after hours for any problems. Including the following:  - excessive redness of the incisions - drainage for more than 4 days - fever of more than 101.5 F  *Please note that pain medications will not be refilled after hours or on weekends.  WORK STATUS: MUST KEEP BANDAGE CLEAN AND DRY NO LIFTING, GRIPPING, OR GRASPING WITH RIGHT HAND MORE THAN 2 POUNDS UNTIL RE-EVALUATED   Post Anesthesia Home Care Instructions  Activity: Get plenty of rest for the remainder of the day. A responsible adult should stay with you for 24 hours following the procedure.  For the next 24 hours, DO NOT: -Drive a car -Advertising copywriterperate machinery -Drink alcoholic beverages -Take any medication unless instructed by your physician -Make any legal decisions or sign important papers.  Meals: Start with liquid foods such as gelatin or soup. Progress to regular foods as tolerated. Avoid greasy, spicy, heavy foods. If nausea and/or vomiting occur, drink only clear liquids until the nausea and/or vomiting subsides. Call your physician  if vomiting continues.  Special Instructions/Symptoms: Your throat may feel dry or sore from the anesthesia or the breathing tube placed in your throat during surgery. If this causes discomfort, gargle with warm salt water. The discomfort should disappear within 24 hours.  If you had a scopolamine patch placed behind your ear for the management of post- operative nausea and/or vomiting:  1. The medication in the patch is effective for 72 hours, after which it should be removed.  Wrap patch in a tissue and discard in the trash. Wash hands thoroughly with soap and water. 2. You may remove the patch earlier than 72 hours if you experience unpleasant side effects which may include dry mouth, dizziness or visual disturbances. 3. Avoid touching the patch. Wash your hands with soap and water after contact with the patch.

## 2016-06-07 NOTE — Anesthesia Procedure Notes (Signed)
Procedure Name: LMA Insertion Date/Time: 06/07/2016 10:45 AM Performed by: Gar GibbonKEETON, Mitchell Barber Pre-anesthesia Checklist: Patient identified, Emergency Drugs available, Suction available and Patient being monitored Patient Re-evaluated:Patient Re-evaluated prior to inductionOxygen Delivery Method: Circle system utilized Preoxygenation: Pre-oxygenation with 100% oxygen Intubation Type: IV induction Ventilation: Mask ventilation without difficulty LMA: LMA inserted LMA Size: 4.0 Number of attempts: 1 Airway Equipment and Method: Bite block Placement Confirmation: positive ETCO2 Tube secured with: Tape Dental Injury: Teeth and Oropharynx as per pre-operative assessment

## 2016-06-07 NOTE — Anesthesia Postprocedure Evaluation (Signed)
Anesthesia Post Note  Patient: Mitchell Barber  Procedure(s) Performed: Procedure(s) (LRB): OPEN REDUCTION INTERNAL FIXATION OF RIGHT 5TH METACARPAL FRACTURE (Right)  Patient location during evaluation: PACU Anesthesia Type: General Level of consciousness: awake and alert Pain management: pain level controlled Vital Signs Assessment: post-procedure vital signs reviewed and stable Respiratory status: spontaneous breathing, nonlabored ventilation, respiratory function stable and patient connected to nasal cannula oxygen Cardiovascular status: blood pressure returned to baseline and stable Postop Assessment: no signs of nausea or vomiting Anesthetic complications: no    Last Vitals:  Vitals:   06/07/16 1215 06/07/16 1235  BP: 119/73 133/70  Pulse: 63 (!) 55  Resp: 16 18  Temp:  36.5 C    Last Pain:  Vitals:   06/07/16 1235  TempSrc: Oral  PainSc: 0-No pain                 Shelton SilvasKevin D Meridee Branum

## 2016-06-07 NOTE — Interval H&P Note (Signed)
History and Physical Interval Note:  06/07/2016 9:20 AM  [name]  has presented today for surgery, with the diagnosis of RIGHT 5TH METACARPAL FRACTURE S62.326A  The various methods of treatment have been discussed with the patient and family. After consideration of risks, benefits and other options for treatment, the patient has consented to  Procedure(s): OPEN REDUCTION INTERNAL FIXATION OF RIGHT 5TH METACARPAL FRACTURE (Right) as a surgical intervention .  The patient's history has been reviewed, patient examined, no change in status, stable for surgery.  I have reviewed the patient's chart and labs.  Questions were answered to the patient's satisfaction.     Mitchell Barber A.

## 2016-06-07 NOTE — Op Note (Signed)
06/07/2016  9:21 AM  PATIENT:  Mitchell Barber  25 y.o. male  PRE-OPERATIVE DIAGNOSIS:  Displaced right fifth metacarpal fracture  POST-OPERATIVE DIAGNOSIS:  Same  PROCEDURE:  ORIF right fifth metacarpal fracture  SURGEON: Cliffton Astersavid A. Janee Mornhompson, MD  PHYSICIAN ASSISTANT: Danielle RankinKirsten Schrader, OPA-C  ANESTHESIA:  general  SPECIMENS:  None  DRAINS:   None  EBL:  less than 50 mL  PREOPERATIVE INDICATIONS:  Mitchell Barber is a  25 y.o. male with a displaced and markedly angulated right fifth metacarpal fracture.  The risks benefits and alternatives were discussed with the patient preoperatively including but not limited to the risks of infection, bleeding, nerve injury, cardiopulmonary complications, the need for revision surgery, among others, and the patient verbalized understanding and consented to proceed.  OPERATIVE IMPLANTS: Biomet ALPS small T plate/screws  OPERATIVE PROCEDURE:  After receiving prophylactic antibiotics, the patient was escorted to the operative theatre and placed in a supine position.  General anesthesia was administered.  A surgical "time-out" was performed during which the planned procedure, proposed operative site, and the correct patient identity were compared to the operative consent and agreement confirmed by the circulating nurse according to current facility policy.  Following application of a tourniquet to the operative extremity, the exposed skin was prepped with Chloraprep and draped in the usual sterile fashion.  The limb was exsanguinated with an Esmarch bandage and the tourniquet inflated to approximately 100mmHg higher than systolic BP.  The small finger MCP joint was found to be stiff, and this was manipulated first so that free motion was obtained.  Half percent plain Marcaine was instilled at the wrist for an ulnar nerve block.  A linear longitudinal slightly distally curved incision was marked and made over the fifth metacarpal.  Full-thickness skin  flaps were reflected.  The extensor tendon mechanism was found a little frayed and divided by the fracture, with the EDC displaced radially, and the EDM ulnarly.  The distal split was extended.  The fracture was quite distal, with the distal fragment displaced palmarly and rotated into flexion.  There was a lot of periosteal new bone formation already present in this was removed with a Geographical information systems officeroger.  Once the fracture fragments were appropriately debrided such that near-anatomic alignment could be obtained, the fracture was reduced and held with a clamp.  A small T plate from the Biomet set was selected and fashioned, the distal aspect contour slightly as needed to try to get multiple points of fixation into the head fragment.  It was secured to the construct with a couple of K wires with the fracture held reduced.  With the fracture thus reduced, the holes were sequentially drilled and filled.  For the distal holes, the drill was only passed in such a way that the digit did not perforate the far cortex and screws were picked to be on the shorter side.  After they were placed, motion of the proximal phalanx on the metacarpal head was smooth and fluid and full.  After all the holes were sequentially drilled and filled, in the process removing the provisional K wires, final images were obtained revealing satisfactory reduction.  Range of motion was passively full without any malrotation.  The wound was copiously irrigated and the periosteal elements reapproximated with 4-0 Vicryl interrupted sutures.  The extensor apparatus was closed similarly, with the distal split being closed, thus reapproximating the extensor mechanism back over the dorsal surface.  Tourniquet was released some additional hemostasis obtained and then the skin  was reapproximated with 4-0 Vicryl Rapide running horizontal mattress suture.  A short arm splint dressing was applied and he was taken to the recovery room stable condition, breathing  spontaneously  DISPOSITION: He'll be discharged home today with typical instructions, returning in 7-10 days with follow-on hand therapy appointment for custom splint fabrication and initiation of rehabilitation.

## 2016-06-08 ENCOUNTER — Encounter (HOSPITAL_BASED_OUTPATIENT_CLINIC_OR_DEPARTMENT_OTHER): Payer: Self-pay | Admitting: Orthopedic Surgery

## 2016-06-16 DIAGNOSIS — S62326D Displaced fracture of shaft of fifth metacarpal bone, right hand, subsequent encounter for fracture with routine healing: Secondary | ICD-10-CM | POA: Diagnosis not present

## 2016-06-30 DIAGNOSIS — S62326D Displaced fracture of shaft of fifth metacarpal bone, right hand, subsequent encounter for fracture with routine healing: Secondary | ICD-10-CM | POA: Diagnosis not present

## 2016-07-15 DIAGNOSIS — S62326D Displaced fracture of shaft of fifth metacarpal bone, right hand, subsequent encounter for fracture with routine healing: Secondary | ICD-10-CM | POA: Diagnosis not present

## 2018-03-20 DIAGNOSIS — H5213 Myopia, bilateral: Secondary | ICD-10-CM | POA: Diagnosis not present

## 2018-07-25 IMAGING — RF DG HAND COMPLETE 3+V*R*
1 series · 3 of 3 positions shown · non-contrast
Comparison: 05/21/2016.

CLINICAL DATA: ORIF fifth metacarpal fracture.

EXAM:
DG C-ARM 61-120 MIN; RIGHT HAND - COMPLETE 3+ VIEW

[Series 1: run · 3 of 3 slices shown]
[im 1/3]
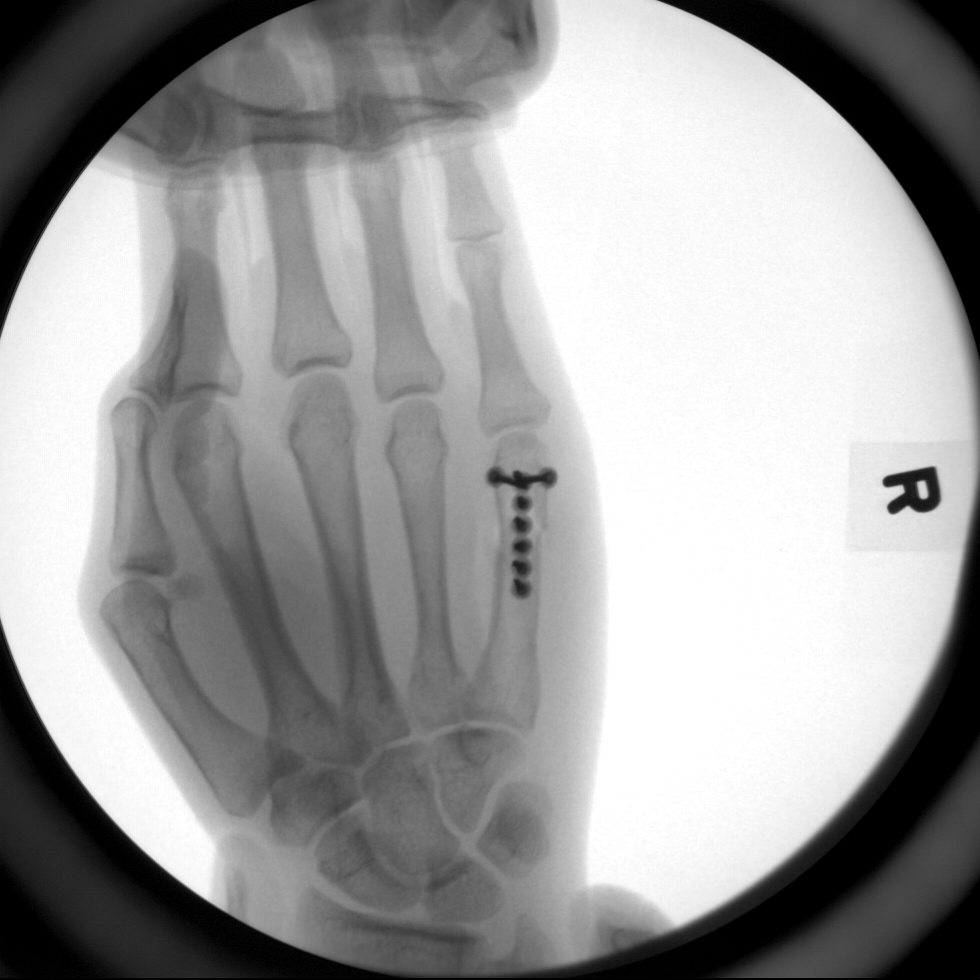
[im 2/3]
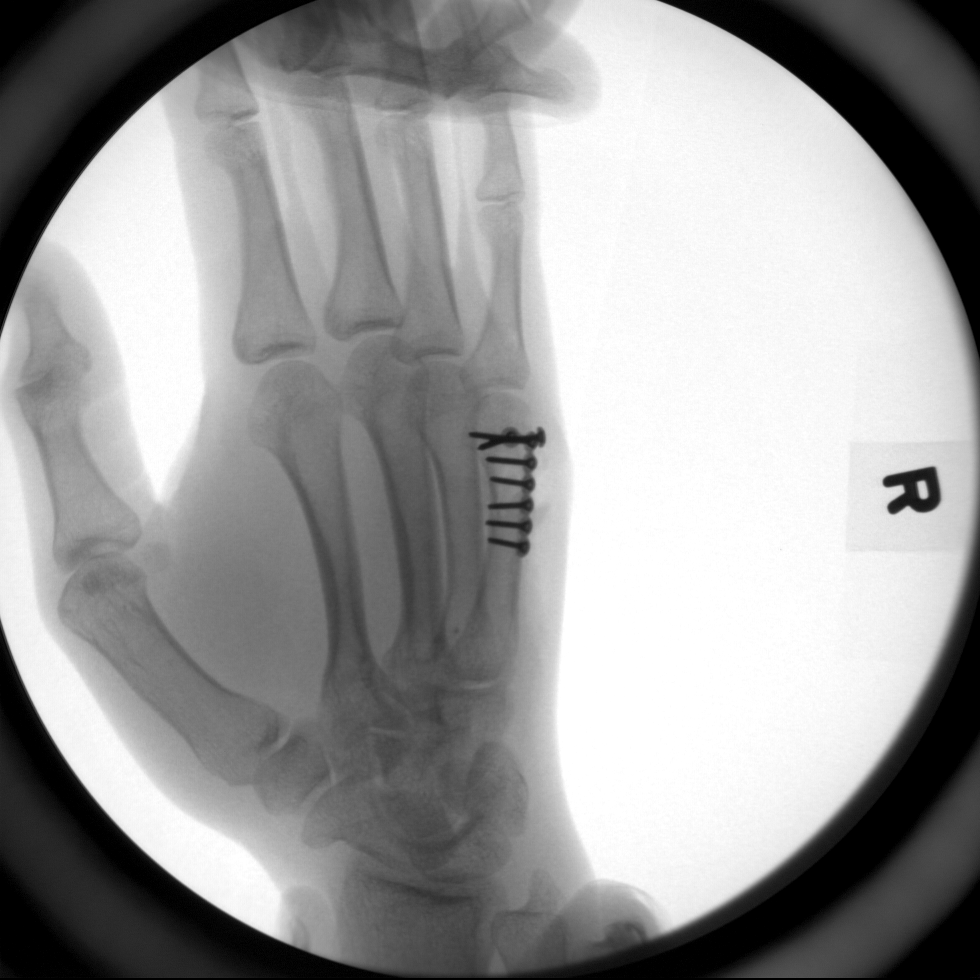
[im 3/3]
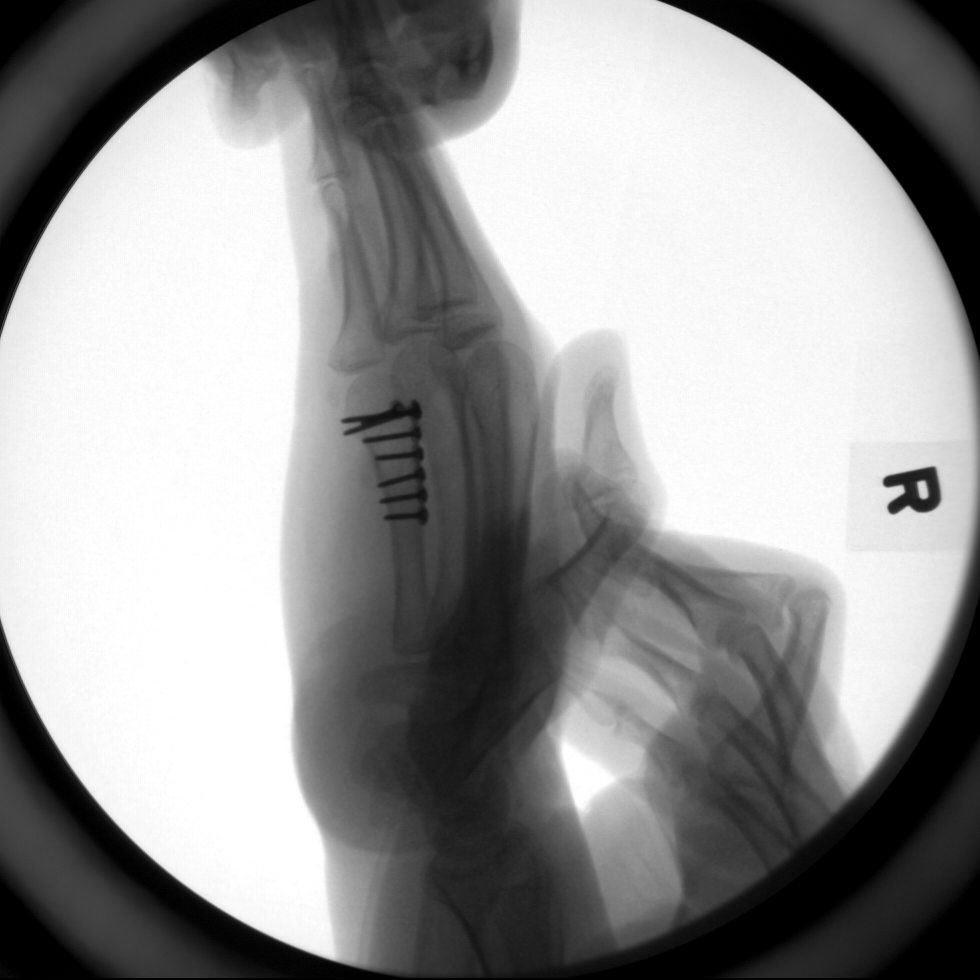

[3 of 3 positions shown; findings below may reference images not displayed]

FINDINGS: ORIF right fifth metacarpal fracture. Anatomic alignment. Hardware
intact . 0 minutes 17 seconds fluoroscopy time. Three images
obtained.
IMPRESSION: ORIF right fifth metacarpal fracture.

## 2018-07-31 ENCOUNTER — Ambulatory Visit (HOSPITAL_COMMUNITY)
Admission: EM | Admit: 2018-07-31 | Discharge: 2018-07-31 | Disposition: A | Discharge status: Home / Self Care | Payer: 59 | Attending: Nurse Practitioner | Admitting: Nurse Practitioner

## 2018-07-31 ENCOUNTER — Other Ambulatory Visit: Payer: Self-pay

## 2018-07-31 ENCOUNTER — Encounter (HOSPITAL_COMMUNITY): Payer: Self-pay

## 2018-07-31 DIAGNOSIS — T22191A Burn of first degree of multiple sites of right shoulder and upper limb, except wrist and hand, initial encounter: Secondary | ICD-10-CM

## 2018-07-31 DIAGNOSIS — T22211A Burn of second degree of right forearm, initial encounter: Secondary | ICD-10-CM

## 2018-07-31 DIAGNOSIS — T2012XA Burn of first degree of lip(s), initial encounter: Secondary | ICD-10-CM

## 2018-07-31 DIAGNOSIS — T23231A Burn of second degree of multiple right fingers (nail), not including thumb, initial encounter: Secondary | ICD-10-CM

## 2018-07-31 DIAGNOSIS — T23201A Burn of second degree of right hand, unspecified site, initial encounter: Secondary | ICD-10-CM | POA: Insufficient documentation

## 2018-07-31 MED ORDER — SILVER SULFADIAZINE 1 % EX CREA: 1.0000 "application " | TOPICAL_CREAM | Freq: Every day | CUTANEOUS | 0 refills | Status: DC
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
## 2018-07-31 NOTE — Discharge Instructions (Signed)
Change your dressing once a day just like we did today. It is very important that you follow-up with the wound clinic. Call as soon as possible to make an appointment.
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
## 2019-06-06 ENCOUNTER — Ambulatory Visit: Payer: Self-pay | Admitting: Internal Medicine

## 2019-06-07 ENCOUNTER — Telehealth: Payer: Self-pay | Admitting: *Deleted

## 2019-06-07 ENCOUNTER — Other Ambulatory Visit (HOSPITAL_COMMUNITY)
Admission: RE | Admit: 2019-06-07 | Discharge: 2019-06-07 | Disposition: A | Discharge status: Home / Self Care | Payer: Self-pay | Source: Ambulatory Visit | Attending: Internal Medicine | Admitting: Internal Medicine

## 2019-06-07 ENCOUNTER — Other Ambulatory Visit: Payer: Self-pay

## 2019-06-07 ENCOUNTER — Ambulatory Visit (INDEPENDENT_AMBULATORY_CARE_PROVIDER_SITE_OTHER): Payer: Self-pay | Admitting: Internal Medicine

## 2019-06-07 VITALS — BP 110/70 | HR 58 | Temp 98.0°F | Wt 158.0 lb

## 2019-06-07 DIAGNOSIS — Z113 Encounter for screening for infections with a predominantly sexual mode of transmission: Secondary | ICD-10-CM | POA: Insufficient documentation

## 2019-06-07 NOTE — Patient Instructions (Signed)
-  Nice seeing you today!!  -Lab work today; will notify you once results are available.   

## 2019-06-07 NOTE — Telephone Encounter (Signed)
Ok with me 

## 2019-06-07 NOTE — Telephone Encounter (Signed)
Mitchell Barber came to the office today and was scheduled by the [facility] to establish care with Mitchell Barber.  They patient would prefer to keep Mitchell Barber as his provider.  He said that he had lost his insurance and that is why he did not continue his medical care.

## 2019-06-07 NOTE — Progress Notes (Signed)
New Patient Office Visit     CC/Reason for Visit: STD screening Previous PCP: Dr. Regis Bill Last Visit: 2016  HPI: Mitchell Barber is a 28 y.o. male who is coming in today for the above mentioned reasons. Used to see Dr. Regis Bill and is interested in re-establishing care with her. He would like an STD screen. States he has had a new partner since last month and "I want to make sure everything is ok, even tho I know it probably is". Partner is male, positive condom use. No penile discharge, dysuria or genital lesions.  Past Medical/Surgical History: Past Medical History:  Diagnosis Date  . Boxer's metacarpal fracture, neck, closed    right  . Childhood overweight, BMI 85-94.9 percentile    30.34 on 05/06/06  . Smoker   . Wears glasses     Past Surgical History:  Procedure Laterality Date  . NO PAST SURGERIES    . OPEN REDUCTION INTERNAL FIXATION (ORIF) METACARPAL Right 06/07/2016   Procedure: OPEN REDUCTION INTERNAL FIXATION OF RIGHT 5TH METACARPAL FRACTURE;  Surgeon: Milly Jakob, MD;  Location: [place];  Service: Orthopedics;  Laterality: Right;  OPEN REDUCTION INTERNAL FIXATION OF RIGHT 5TH METACARPAL FRACTURE    Social History:  reports that he has been smoking cigarettes. He has a 2.50 pack-year smoking history. He has never used smokeless tobacco. He reports current alcohol use. He reports current drug use. Drug: Marijuana.  Allergies: No Known Allergies  Family History:  Family History  Problem Relation Age of Onset  . Diabetes Mother   . Hypertension Father      Current Outpatient Medications:  .  acetaminophen (TYLENOL) 325 MG tablet, Take 2 tablets (650 mg total) by mouth every 6 (six) hours as needed for mild pain or moderate pain., Disp: , Rfl:  .  ibuprofen (ADVIL) 200 MG tablet, Take 3 tablets (600 mg total) by mouth every 6 (six) hours as needed for mild pain or moderate pain., Disp: , Rfl:   Review of Systems:  Constitutional:  Denies fever, chills, diaphoresis, appetite change and fatigue.  HEENT: Denies photophobia, eye pain, redness, hearing loss, ear pain, congestion, sore throat, rhinorrhea, sneezing, mouth sores, trouble swallowing, neck pain, neck stiffness and tinnitus.   Respiratory: Denies SOB, DOE, cough, chest tightness,  and wheezing.   Cardiovascular: Denies chest pain, palpitations and leg swelling.  Gastrointestinal: Denies nausea, vomiting, abdominal pain, diarrhea, constipation, blood in stool and abdominal distention.  Genitourinary: Denies dysuria, urgency, frequency, hematuria, flank pain and difficulty urinating.  Endocrine: Denies: hot or cold intolerance, sweats, changes in hair or nails, polyuria, polydipsia. Musculoskeletal: Denies myalgias, back pain, joint swelling, arthralgias and gait problem.  Skin: Denies pallor, rash and wound.  Neurological: Denies dizziness, seizures, syncope, weakness, light-headedness, numbness and headaches.  Hematological: Denies adenopathy. Easy bruising, personal or family bleeding history  Psychiatric/Behavioral: Denies suicidal ideation, mood changes, confusion, nervousness, sleep disturbance and agitation    Physical Exam: Vitals:   06/07/19 1109  BP: 110/70  Pulse: (!) 58  Temp: 98 F (36.7 C)  TempSrc: Temporal  SpO2: 97%  Weight: 158 lb (71.7 kg)   Body mass index is 24.75 kg/m.  Constitutional: NAD, calm, comfortable Eyes: PERRL, lids and conjunctivae normal, wears corrective lenses. ENMT: Mucous membranes are moist.  Respiratory: clear to auscultation bilaterally, no wheezing, no crackles. Normal respiratory effort. No accessory muscle use.  Cardiovascular: Regular rate and rhythm, no murmurs / rubs / gallops. No extremity edema. 2+ pedal pulses.  Abdomen: no tenderness, no masses palpated. No hepatosplenomegaly. Bowel sounds positive.  Neurologic: grossly intact and nonfocal   Impression and Plan:  Screen for STD (sexually transmitted  disease)  - Plan: HIV antibody (with reflex), RPR, Hepatitis panel, acute, Urine cytology ancillary only  -Will schedule appointment to re-establish care with Dr. Fabian Sharp.    Patient Instructions  -Nice seeing you today!!  -Lab work today; will notify you once results are available.       Chaya Jan, MD [facility]

## 2019-06-08 LAB — HEPATITIS PANEL, ACUTE
Hep A IgM: NONREACTIVE
Hep B C IgM: NONREACTIVE
Hepatitis B Surface Ag: NONREACTIVE
Hepatitis C Ab: NONREACTIVE
SIGNAL TO CUT-OFF: 0.01 (ref <1.00)

## 2019-06-08 LAB — URINE CYTOLOGY ANCILLARY ONLY
Chlamydia: NEGATIVE
Comment: NEGATIVE
Comment: NEGATIVE
Comment: NORMAL
Neisseria Gonorrhea: NEGATIVE
Trichomonas: NEGATIVE

## 2019-06-08 LAB — HIV ANTIBODY (ROUTINE TESTING W REFLEX): HIV 1&2 Ab, 4th Generation: NONREACTIVE

## 2019-06-08 LAB — RPR: RPR Ser Ql: NONREACTIVE

## 2019-10-22 ENCOUNTER — Ambulatory Visit: Payer: Self-pay | Attending: Internal Medicine

## 2019-10-22 DIAGNOSIS — Z23 Encounter for immunization: Secondary | ICD-10-CM

## 2019-10-22 NOTE — Progress Notes (Signed)
   Covid-19 Vaccination Clinic  Name:  PIPER ALBRO    MRN: [id number] DOB: August 13, 1990  10/22/2019  Mr. Yoo was observed post Covid-19 immunization for 15 minutes without incident. He was provided with Vaccine Information Sheet and instruction to access the V-Safe system.   Mr. Mclinden was instructed to call 911 with any severe reactions post vaccine: Marland Kitchen Difficulty breathing  . Swelling of face and throat  . A fast heartbeat  . A bad rash all over body  . Dizziness and weakness   Immunizations Administered    Name Date Dose VIS Date Route   Pfizer COVID-19 Vaccine 10/22/2019  8:15 AM 0.3 mL 07/13/2019 Intramuscular   Manufacturer: [employer], [employer]   Lot: [id number]   NDC: [address]-5

## 2019-11-14 ENCOUNTER — Ambulatory Visit: Payer: Self-pay | Attending: Internal Medicine

## 2019-11-14 DIAGNOSIS — Z23 Encounter for immunization: Secondary | ICD-10-CM

## 2019-11-14 NOTE — Progress Notes (Signed)
   Covid-19 Vaccination Clinic  Name:  Mitchell Barber    MRN: [id number] DOB: 1991-02-22  11/14/2019  Mr. [name] was observed post Covid-19 immunization for 15 minutes without incident. He was provided with Vaccine Information Sheet and instruction to access the V-Safe system.   Mr. [name] was instructed to call 911 with any severe reactions post vaccine: Marland Kitchen Difficulty breathing  . Swelling of face and throat  . A fast heartbeat  . A bad rash all over body  . Dizziness and weakness   Immunizations Administered    Name Date Dose VIS Date Route   Pfizer COVID-19 Vaccine 11/14/2019  8:58 AM 0.3 mL 07/13/2019 Intramuscular   Manufacturer: [employer], [employer]   Lot: [id number]   NDC: [address]-2

## 2021-07-22 ENCOUNTER — Other Ambulatory Visit (HOSPITAL_COMMUNITY): Payer: Self-pay

## 2021-07-22 ENCOUNTER — Encounter: Payer: Self-pay | Admitting: Internal Medicine

## 2021-07-22 ENCOUNTER — Other Ambulatory Visit (HOSPITAL_COMMUNITY)
Admission: RE | Admit: 2021-07-22 | Discharge: 2021-07-22 | Disposition: A | Discharge status: Home / Self Care | Payer: Self-pay | Source: Ambulatory Visit | Attending: Internal Medicine | Admitting: Internal Medicine

## 2021-07-22 ENCOUNTER — Ambulatory Visit (INDEPENDENT_AMBULATORY_CARE_PROVIDER_SITE_OTHER): Payer: Self-pay | Admitting: Internal Medicine

## 2021-07-22 VITALS — BP 124/80 | HR 54 | Temp 98.0°F | Ht 67.0 in | Wt 161.9 lb

## 2021-07-22 DIAGNOSIS — R319 Hematuria, unspecified: Secondary | ICD-10-CM

## 2021-07-22 LAB — POCT URINALYSIS DIPSTICK
Bilirubin, UA: NEGATIVE
Blood, UA: POSITIVE
Glucose, UA: NEGATIVE
Ketones, UA: NEGATIVE
Leukocytes, UA: NEGATIVE
Nitrite, UA: NEGATIVE
Protein, UA: POSITIVE — AB
Spec Grav, UA: 1.015 (ref 1.010–1.025)
Urobilinogen, UA: 0.2 E.U./dL
pH, UA: 5.5 (ref 5.0–8.0)

## 2021-07-22 MED ORDER — SULFAMETHOXAZOLE-TRIMETHOPRIM 800-160 MG PO TABS
1.0000 | ORAL_TABLET | Freq: Two times a day (BID) | ORAL | 0 refills | Status: AC
  Filled 2021-07-22: qty 14, 7d supply, fill #0

## 2021-07-22 NOTE — Progress Notes (Signed)
Acute office Visit     This visit occurred during the SARS-CoV-2 public health emergency.  Safety protocols were in place, including screening questions prior to the visit, additional usage of staff PPE, and extensive cleaning of exam room while observing appropriate contact time as indicated for disinfecting solutions.    CC/Reason for Visit: Blood in urine  HPI: Mitchell Barber is a 30 y.o. male who is coming in today for the above mentioned reasons.  No past medical history of significance.  4 days ago he noticed bright red blood in his urine towards the end of his stream.  Ever since then he has been having hematuria, he describes dysuria, frequency, urgency and right flank pain.  He declines fevers.  Past Medical/Surgical History: Past Medical History:  Diagnosis Date   Boxer's metacarpal fracture, neck, closed    right   Childhood overweight, BMI 85-94.9 percentile    30.34 on 05/06/06   Smoker    Wears glasses     Past Surgical History:  Procedure Laterality Date   NO PAST SURGERIES     OPEN REDUCTION INTERNAL FIXATION (ORIF) METACARPAL Right 06/07/2016   Procedure: OPEN REDUCTION INTERNAL FIXATION OF RIGHT 5TH METACARPAL FRACTURE;  Surgeon: Mack Hook, MD;  Location: [facility];  Service: Orthopedics;  Laterality: Right;  OPEN REDUCTION INTERNAL FIXATION OF RIGHT 5TH METACARPAL FRACTURE    Social History:  reports that he has been smoking cigarettes. He has a 2.50 pack-year smoking history. He has never used smokeless tobacco. He reports current alcohol use. He reports current drug use. Drug: Marijuana.  Allergies: No Known Allergies  Family History:  Family History  Problem Relation Age of Onset   Diabetes Mother    Hypertension Father      Current Outpatient Medications:    acetaminophen (TYLENOL) 325 MG tablet, Take 2 tablets (650 mg total) by mouth every 6 (six) hours as needed for mild pain or moderate pain., Disp: , Rfl:     ibuprofen (ADVIL) 200 MG tablet, Take 3 tablets (600 mg total) by mouth every 6 (six) hours as needed for mild pain or moderate pain., Disp: , Rfl:   Review of Systems:  Constitutional: Denies fever, chills, diaphoresis, appetite change and fatigue.  HEENT: Denies photophobia, eye pain, redness, hearing loss, ear pain, congestion, sore throat, rhinorrhea, sneezing, mouth sores, trouble swallowing, neck pain, neck stiffness and tinnitus.   Respiratory: Denies SOB, DOE, cough, chest tightness,  and wheezing.   Cardiovascular: Denies chest pain, palpitations and leg swelling.  Gastrointestinal: Denies nausea, vomiting, abdominal pain, diarrhea, constipation, blood in stool and abdominal distention.  Genitourinary: Positive for dysuria, urgency, frequency, hematuria, flank pain and difficulty urinating.  Endocrine: Denies: hot or cold intolerance, sweats, changes in hair or nails, polyuria, polydipsia. Musculoskeletal: Denies myalgias, back pain, joint swelling, arthralgias and gait problem.  Skin: Denies pallor, rash and wound.  Neurological: Denies dizziness, seizures, syncope, weakness, light-headedness, numbness and headaches.  Hematological: Denies adenopathy. Easy bruising, personal or family bleeding history  Psychiatric/Behavioral: Denies suicidal ideation, mood changes, confusion, nervousness, sleep disturbance and agitation    Physical Exam: Vitals:   07/22/21 1503  BP: 124/80  Pulse: (!) 54  Temp: 98 F (36.7 C)  TempSrc: Oral  SpO2: 99%  Weight: 161 lb 14.4 oz (73.4 kg)  Height: 5\' 7"  (1.702 m)    Body mass index is 25.36 kg/m.   Constitutional: NAD, calm, comfortable Eyes: PERRL, lids and conjunctivae normal ENMT: Mucous membranes are  moist.  Respiratory: clear to auscultation bilaterally, no wheezing, no crackles. Normal respiratory effort. No accessory muscle use.  Cardiovascular: Regular rate and rhythm, no murmurs / rubs / gallops. No extremity edema.   Psychiatric: Normal judgment and insight. Alert and oriented x 3. Normal mood.    Impression and Plan:  Hematuria, unspecified type  - Plan: POCT urinalysis dipstick -Urine dipstick with 3+ blood, no nitrates, no leuks. -Given his symptoms, will treat as a UTI with Bactrim DS.  Given his age group we will also send for [facility] and chlamydia. -If fails to resolve may need to consider imaging.  Time spent: 30 minutes reviewing chart, interviewing and examining patient and formulating plan of care.     Lelon Frohlich, MD [facility]

## 2021-07-23 ENCOUNTER — Ambulatory Visit (HOSPITAL_COMMUNITY)
Admission: EM | Admit: 2021-07-23 | Discharge: 2021-07-23 | Disposition: A | Discharge status: Home / Self Care | Payer: No Payment, Other | Attending: Psychiatry | Admitting: Psychiatry

## 2021-07-23 DIAGNOSIS — F29 Unspecified psychosis not due to a substance or known physiological condition: Secondary | ICD-10-CM | POA: Insufficient documentation

## 2021-07-23 DIAGNOSIS — F411 Generalized anxiety disorder: Secondary | ICD-10-CM | POA: Insufficient documentation

## 2021-07-23 LAB — URINALYSIS, ROUTINE W REFLEX MICROSCOPIC
Bilirubin Urine: NEGATIVE
Ketones, ur: NEGATIVE
Leukocytes,Ua: NEGATIVE
Nitrite: NEGATIVE
Specific Gravity, Urine: 1.015 (ref 1.000–1.030)
Total Protein, Urine: NEGATIVE
Urine Glucose: NEGATIVE
Urobilinogen, UA: 0.2 (ref 0.0–1.0)
pH: 5.5 (ref 5.0–8.0)

## 2021-07-23 LAB — URINE CULTURE
MICRO NUMBER:: 12786803
SPECIMEN QUALITY:: ADEQUATE

## 2021-07-23 MED ORDER — TRAZODONE HCL 50 MG PO TABS
50.0000 mg | ORAL_TABLET | Freq: Every evening | ORAL | 0 refills | Status: AC | PRN
  Filled 2021-07-24: qty 30, 30d supply, fill #0

## 2021-07-23 MED ORDER — HYDROXYZINE PAMOATE 50 MG PO CAPS: 50.0000 mg | ORAL_CAPSULE | Freq: Three times a day (TID) | ORAL | 0 refills | Status: AC | PRN

## 2021-07-23 NOTE — Progress Notes (Signed)
AVS, including RX, follow up appointments and resources, reviewed with patient.  All questions answered.  Patient verbalized understanding of information presented.  Patient denied SI, HI, AVH.  Stated he felt safe and ready for discharge. All belongings returned and belongings sheet signed.  Patient discharged to home accompanied by self.  Patient discharged in stable condition; no acute distress noted.

## 2021-07-23 NOTE — Discharge Instructions (Addendum)
Patient is instructed prior to discharge to: Return for an outpatient walk-in appointment on 07/28/2021 at 7:30 AM to establish care with a psychiatric provider and therapist. Take all medications as prescribed by his/her mental healthcare provider. Report any adverse effects and or reactions from the medicines to his/her outpatient provider promptly. Patient has been instructed & cautioned: To not engage in alcohol and or illegal drug use while on prescription medicines. In the event of worsening symptoms, patient is instructed to call the crisis hotline, 911 and or go to the nearest ED for appropriate evaluation and treatment of symptoms. To follow-up with his/her primary care provider for your other medical issues, concerns and or health care needs.

## 2021-07-23 NOTE — Progress Notes (Signed)
°   07/23/21 1124  [facility] Triage Screening (Walk-ins at [facility] only)  What Is the Reason for Your Visit/Call Today? 30 year old male present to [facility] expressing, "I am here because I have temper tantrums. I have bad communication issues and anger issues. It flares up towards people that I don't know very well."  Patient reported he has bad communication issues and he has to be careful what he say towards others. Denied suicidal/homicidal ideations. Report hearing a voice in his head but acknowledges he does not listen to them. Report visual hallucination of seeing shadow people. Denied being on medication. Report experiencing auditory/visual hallucinations for the past 5-7 years.  How Long Has This Been Causing You Problems? > than 6 months (report experiencing auditory/visual hallucination past 5-7 years.)  Have You Recently Had Any Thoughts About Hurting Yourself? No  Are You Planning to Commit Suicide/Harm Yourself At This time? No  Are You Planning To Harm Someone At This Time? No  Are you currently experiencing any auditory, visual or other hallucinations? Yes  Please explain the hallucinations you are currently experiencing: report seeing shadows and hearing voices.  Have You Used Any Alcohol or Drugs in the Past 24 Hours? No  Do you have any current medical co-morbidities that require immediate attention? No  Clinician description of patient physical appearance/behavior: dressed appropriately for the weather  What Do You Feel Would Help You the Most Today? Medication(s)  If access to [facility] was not available, would you have sought care in the Emergency Department? Yes  Determination of Need Routine (7 days)  Options For Referral Medication Management
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
## 2021-07-24 ENCOUNTER — Other Ambulatory Visit: Payer: Self-pay

## 2021-07-24 ENCOUNTER — Other Ambulatory Visit (HOSPITAL_COMMUNITY): Payer: Self-pay

## 2021-07-24 LAB — URINE CYTOLOGY ANCILLARY ONLY
Chlamydia: NEGATIVE
Comment: NEGATIVE
Comment: NORMAL
Neisseria Gonorrhea: NEGATIVE

## 2021-07-24 MED ORDER — HYDROXYZINE HCL 50 MG PO TABS
50.0000 mg | ORAL_TABLET | Freq: Three times a day (TID) | ORAL | 0 refills | Status: AC | PRN
  Filled 2021-07-24: qty 30, 10d supply, fill #0

## 2021-08-03 ENCOUNTER — Telehealth (HOSPITAL_COMMUNITY): Payer: Self-pay | Admitting: Internal Medicine

## 2021-08-03 NOTE — BH Assessment (Signed)
Care Management - Follow Up [facility] Discharges   Writer made contact with the patient. Patient reports that he will follow up with his Open Access at [facility] on the 2nd floor when it opens on August 04, 2021.

## 2021-09-24 ENCOUNTER — Ambulatory Visit (HOSPITAL_COMMUNITY): Payer: No Payment, Other | Admitting: Psychiatry

## 2022-11-16 ENCOUNTER — Ambulatory Visit (HOSPITAL_COMMUNITY)
Admission: EM | Admit: 2022-11-16 | Discharge: 2022-11-16 | Disposition: A | Discharge status: Home / Self Care | Payer: No Payment, Other | Attending: Psychiatry | Admitting: Psychiatry

## 2022-11-16 DIAGNOSIS — R4589 Other symptoms and signs involving emotional state: Secondary | ICD-10-CM

## 2022-11-16 DIAGNOSIS — F419 Anxiety disorder, unspecified: Secondary | ICD-10-CM | POA: Insufficient documentation

## 2022-11-16 DIAGNOSIS — Z566 Other physical and mental strain related to work: Secondary | ICD-10-CM | POA: Insufficient documentation

## 2022-11-16 DIAGNOSIS — F32A Depression, unspecified: Secondary | ICD-10-CM | POA: Insufficient documentation

## 2022-11-16 NOTE — Progress Notes (Signed)
   11/16/22 1924  [facility] Triage Screening (Walk-ins at [facility] only)  How Did You Hear About [place]? Self  What Is the Reason for Your Visit/Call Today? Pt presents to [facility] voluntarily, unaccompanied at this time due to stress and anxiety. Pt initially stated he was laid off from his job in March and that is when his anxiety began. Pt recently obtained a new job and now feels that he will  not be able to maintain his current job. Pt feels that he is unable to function at work as he cannot focus and sometimes has heachaches that make it difficult to work. Pt denies being on medications.  Pt denies SI, HI, AVH and substance/alcohol use.  How Long Has This Been Causing You Problems? 1-6 months  Have You Recently Had Any Thoughts About Hurting Yourself? No  Are You Planning to Commit Suicide/Harm Yourself At This time? No  Have you Recently Had Thoughts About Hurting Someone Karolee Ohs? No  Are You Planning To Harm Someone At This Time? No  Are you currently experiencing any auditory, visual or other hallucinations? No  Have You Used Any Alcohol or Drugs in the Past 24 Hours? No  Do you have any current medical co-morbidities that require immediate attention? No (pt does report headaches due to stress)  Clinician description of patient physical appearance/behavior: dressed casually and appropriate for the weather, calm and cooperative  What Do You Feel Would Help You the Most Today? Treatment for Depression or other mood problem;Stress Management;Medication(s)  If access to [facility]gent Care was not available, would you have sought care in the Emergency Department? No  Determination of Need Routine (7 days)  Options For Referral Other: Comment;Outpatient Therapy;Medication Management

## 2022-11-16 NOTE — ED Provider Notes (Signed)
[facility] Medical Screening Exam  Patient Name: Mitchell Barber MRN: [id number] Date of Evaluation: 11/16/22 Chief Complaint:  increase stress Diagnosis:  Final diagnoses:  Ineffective coping  Anxious mood  Stress at work    History of Present illness: Mitchell Barber is a 32 y.o. male. With a history of adjustment disorder, anxiety presented to [facility] voluntarily.  Per the patient he is stressed out and the main reason I got laid off from my job few months ago and now they brought me back but I am not up to 100%.  Patient reported that he lives at home with his parents who are currently retired and he is responsible for most of the bills at home.  Patient denies having a therapist or psychiatrist at this time.  Patient denies any other factor apart from his work situation.  Patient appeared to be a little delay in his intellectual capacity.  Writer tried to rationalize with patient that may be he needs to get a second job if he is not making enough income however patient stated that would make him late for his primary job.    Face-to-face observation of patient, patient is alert and oriented x 4, speech is clear, however patient is delayed in his response.  Patient answers all questions and is very cooperative.  Patient mood is labile and his affect is flat.  Patient denies SI, HI, AVH or paranoia at this time.  Patient denies alcohol use, patient denies illicit drug use patient denies smoking.  Discussed with patient the need to probably talk to her therapist to help him through this stressful period.  Patient is receptive to the idea.  Recommend discharge the patient to follow-up with outpatient therapy provided       Flowsheet Row ED from 11/16/2022 in [facility]  C-SSRS RISK CATEGORY No Risk       Psychiatric Specialty Exam  Presentation  General Appearance:Casual  Eye Contact:Good  Speech:Clear and Coherent  Speech  Volume:Decreased  Handedness:Right   Mood and Affect  Mood: Anxious; Depressed  Affect: Labile   Thought Process  Thought Processes: Coherent  Descriptions of Associations:Circumstantial  Orientation:Full (Time, Place and Person)  Thought Content:WDL    Hallucinations:None  Ideas of Reference:None  Suicidal Thoughts:No  Homicidal Thoughts:No   Sensorium  Memory: Immediate Fair  Judgment: Fair  Insight: Good   Executive Functions  Concentration: Good  Attention Span: Good  Recall: Good  Fund of Knowledge: Good  Language: Good   Psychomotor Activity  Psychomotor Activity: Normal   Assets  Assets: Desire for Improvement; Social Support   Sleep  Sleep: Fair  Number of hours:  6   Physical Exam: Physical Exam HENT:     Head: Normocephalic.     Nose: Nose normal.  Cardiovascular:     Rate and Rhythm: Normal rate.  Pulmonary:     Effort: Pulmonary effort is normal.  Musculoskeletal:        General: Normal range of motion.     Cervical back: Normal range of motion.  Neurological:     General: No focal deficit present.     Mental Status: He is alert.  Psychiatric:        Mood and Affect: Mood normal.    Review of Systems  Constitutional: Negative.   HENT: Negative.    Eyes: Negative.   Respiratory: Negative.    Cardiovascular: Negative.   Gastrointestinal: Negative.   Genitourinary: Negative.   Musculoskeletal: Negative.  Skin: Negative.   Neurological: Negative.   Psychiatric/Behavioral:  Positive for depression. The patient is nervous/anxious.    Blood pressure 134/72, pulse 77, temperature 98.2 F (36.8 C), temperature source Oral, resp. rate 19, SpO2 100 %. There is no height or weight on file to calculate BMI.  Musculoskeletal: Strength & Muscle Tone: within normal limits Gait & Station: normal Patient leans: N/A   [facility] MSE Discharge Disposition for Follow up and Recommendations: Based on my evaluation  the patient does not appear to have an emergency medical condition and can be discharged with resources and follow up care in outpatient services for Individual Therapy   Sindy Guadeloupe, NP 11/16/2022, 8:40 PM

## 2022-11-16 NOTE — Discharge Instructions (Signed)
  Patient has established services with LCSW, [name] at the [facility] for outpatient therapy, case management services and medication management .  In case of an urgent crisis, you may contact the Mobile Crisis Unit with [facility] at 1.877.626.1772.  Patient is instructed prior to discharge to:  Take all medications as prescribed by his/her mental healthcare provider. Report any adverse effects and or reactions from the medicines to his/her outpatient provider promptly. Keep all scheduled appointments, to ensure that you are getting refills on time and to avoid any interruption in your medication.  If you are unable to keep an appointment call to reschedule.  Be sure to follow-up with resources and follow-up appointments provided.  Patient has been instructed & cautioned: To not engage in alcohol and or illegal drug use while on prescription medicines. In the event of worsening symptoms, patient is instructed to call the crisis hotline, 911 and or go to the nearest ED for appropriate evaluation and treatment of symptoms. To follow-up with his/her primary care provider for your other medical issues, concerns and or health care needs.  Information: -National Suicide Prevention Lifeline 1-800-SUICIDE or 1-800-273-8255.  -988 offers 24/7 access to trained crisis counselors who can help people experiencing mental health-related distress. People can call or text 988 or chat 988lifeline.org for themselves or if they are worried about a loved one who may need crisis support.
